# Patient Record
Sex: Male | Born: 1935 | Race: White | Hispanic: No | Smoking: Former smoker
Health system: Southern US, Community
[De-identification: ages and names within clinical notes are randomized; demographics above are authoritative.]

## PROBLEM LIST (undated history)

## (undated) DIAGNOSIS — I509 Heart failure, unspecified: Secondary | ICD-10-CM

## (undated) DIAGNOSIS — E785 Hyperlipidemia, unspecified: Secondary | ICD-10-CM

## (undated) DIAGNOSIS — I82409 Acute embolism and thrombosis of unspecified deep veins of unspecified lower extremity: Secondary | ICD-10-CM

## (undated) DIAGNOSIS — Z8739 Personal history of other diseases of the musculoskeletal system and connective tissue: Secondary | ICD-10-CM

## (undated) DIAGNOSIS — N289 Disorder of kidney and ureter, unspecified: Secondary | ICD-10-CM

## (undated) DIAGNOSIS — J449 Chronic obstructive pulmonary disease, unspecified: Secondary | ICD-10-CM

## (undated) DIAGNOSIS — C801 Malignant (primary) neoplasm, unspecified: Secondary | ICD-10-CM

## (undated) DIAGNOSIS — Z87442 Personal history of urinary calculi: Secondary | ICD-10-CM

## (undated) DIAGNOSIS — Z8546 Personal history of malignant neoplasm of prostate: Secondary | ICD-10-CM

## (undated) DIAGNOSIS — I10 Essential (primary) hypertension: Secondary | ICD-10-CM

## (undated) DIAGNOSIS — M1990 Unspecified osteoarthritis, unspecified site: Secondary | ICD-10-CM

## (undated) HISTORY — DX: Personal history of urinary calculi: Z87.442

## (undated) HISTORY — DX: Unspecified osteoarthritis, unspecified site: M19.90

## (undated) HISTORY — DX: Personal history of malignant neoplasm of prostate: Z85.46

## (undated) HISTORY — DX: Essential (primary) hypertension: I10

## (undated) HISTORY — PX: KIDNEY STONE SURGERY: SHX686

## (undated) HISTORY — DX: Personal history of other diseases of the musculoskeletal system and connective tissue: Z87.39

## (undated) HISTORY — DX: Hyperlipidemia, unspecified: E78.5

---

## 2004-10-13 ENCOUNTER — Inpatient Hospital Stay: Payer: Self-pay | Admitting: Internal Medicine

## 2004-10-13 ENCOUNTER — Ambulatory Visit: Payer: Self-pay | Admitting: Internal Medicine

## 2005-04-13 ENCOUNTER — Ambulatory Visit: Payer: Self-pay | Admitting: Radiation Oncology

## 2005-05-13 ENCOUNTER — Ambulatory Visit: Payer: Self-pay | Admitting: Radiation Oncology

## 2005-07-19 ENCOUNTER — Ambulatory Visit: Payer: Self-pay | Admitting: Unknown Physician Specialty

## 2005-12-08 ENCOUNTER — Ambulatory Visit: Payer: Self-pay | Admitting: Unknown Physician Specialty

## 2006-04-18 ENCOUNTER — Ambulatory Visit: Payer: Self-pay | Admitting: Radiation Oncology

## 2006-04-30 ENCOUNTER — Ambulatory Visit: Payer: Self-pay | Admitting: Unknown Physician Specialty

## 2006-05-13 ENCOUNTER — Ambulatory Visit: Payer: Self-pay | Admitting: Radiation Oncology

## 2007-04-18 ENCOUNTER — Ambulatory Visit: Payer: Self-pay | Admitting: Radiation Oncology

## 2007-05-14 ENCOUNTER — Ambulatory Visit: Payer: Self-pay | Admitting: Radiation Oncology

## 2007-10-02 ENCOUNTER — Ambulatory Visit: Payer: Self-pay | Admitting: Unknown Physician Specialty

## 2008-04-13 ENCOUNTER — Ambulatory Visit: Payer: Self-pay | Admitting: Radiation Oncology

## 2008-04-16 ENCOUNTER — Ambulatory Visit: Payer: Self-pay | Admitting: Radiation Oncology

## 2008-05-13 ENCOUNTER — Ambulatory Visit: Payer: Self-pay | Admitting: Radiation Oncology

## 2009-10-16 ENCOUNTER — Inpatient Hospital Stay: Payer: Self-pay | Admitting: Internal Medicine

## 2010-03-26 ENCOUNTER — Inpatient Hospital Stay: Payer: Self-pay | Admitting: Internal Medicine

## 2015-07-07 ENCOUNTER — Other Ambulatory Visit: Payer: Self-pay | Admitting: Internal Medicine

## 2015-07-07 DIAGNOSIS — R9389 Abnormal findings on diagnostic imaging of other specified body structures: Secondary | ICD-10-CM

## 2015-07-13 ENCOUNTER — Ambulatory Visit
Admission: RE | Admit: 2015-07-13 | Discharge: 2015-07-13 | Disposition: A | Discharge status: Home / Self Care | Payer: Medicare Other | Source: Ambulatory Visit | Attending: Internal Medicine | Admitting: Internal Medicine

## 2015-07-13 DIAGNOSIS — J439 Emphysema, unspecified: Secondary | ICD-10-CM | POA: Diagnosis not present

## 2015-07-13 DIAGNOSIS — K8020 Calculus of gallbladder without cholecystitis without obstruction: Secondary | ICD-10-CM | POA: Insufficient documentation

## 2015-07-13 DIAGNOSIS — R918 Other nonspecific abnormal finding of lung field: Secondary | ICD-10-CM | POA: Diagnosis present

## 2015-07-13 DIAGNOSIS — R911 Solitary pulmonary nodule: Secondary | ICD-10-CM | POA: Insufficient documentation

## 2015-07-13 DIAGNOSIS — R9389 Abnormal findings on diagnostic imaging of other specified body structures: Secondary | ICD-10-CM

## 2015-07-13 DIAGNOSIS — R938 Abnormal findings on diagnostic imaging of other specified body structures: Secondary | ICD-10-CM | POA: Diagnosis present

## 2015-07-13 MED ORDER — IOHEXOL 300 MG/ML  SOLN
75.0000 mL | Freq: Once | INTRAMUSCULAR | Status: AC | PRN
  Administered 2015-07-13: 75 mL via INTRAVENOUS

## 2015-07-15 DIAGNOSIS — I82409 Acute embolism and thrombosis of unspecified deep veins of unspecified lower extremity: Secondary | ICD-10-CM

## 2015-07-15 HISTORY — DX: Acute embolism and thrombosis of unspecified deep veins of unspecified lower extremity: I82.409

## 2015-07-26 ENCOUNTER — Ambulatory Visit: Payer: Medicare Other | Admitting: Internal Medicine

## 2015-07-26 ENCOUNTER — Other Ambulatory Visit: Payer: Medicare Other

## 2015-07-27 ENCOUNTER — Encounter: Payer: Self-pay | Admitting: *Deleted

## 2015-07-29 ENCOUNTER — Inpatient Hospital Stay: Payer: Medicare Other | Admitting: Cardiothoracic Surgery

## 2015-07-29 ENCOUNTER — Telehealth: Payer: Self-pay | Admitting: *Deleted

## 2015-07-29 NOTE — Telephone Encounter (Signed)
States he only got notice of todays appt in mail today and that is not enough time to make arrangements to get his father here. Will not be here for appt today, please call him to reschedule appt

## 2015-08-19 ENCOUNTER — Encounter: Payer: Self-pay | Admitting: Cardiothoracic Surgery

## 2015-08-19 ENCOUNTER — Inpatient Hospital Stay: Payer: Medicare Other | Attending: Cardiothoracic Surgery | Admitting: Cardiothoracic Surgery

## 2015-08-19 VITALS — BP 132/81 | HR 92 | Temp 98.5°F | Resp 92 | Ht 71.0 in | Wt 257.9 lb

## 2015-08-19 DIAGNOSIS — R918 Other nonspecific abnormal finding of lung field: Secondary | ICD-10-CM | POA: Insufficient documentation

## 2015-08-19 NOTE — Progress Notes (Signed)
Patient ID: Kirk Hubbard, male   DOB: 06/21/1923, 81 y.o.   MRN: [id number]  Chief Complaint  Patient presents with  . Right upper lobe lesion    Referral- Dr. Ginette Pitman    Referred By Dr. Matthew Saras the Reason for Referral left upper lobe mass  HPI Location, Quality, Duration, Severity, Timing, Context, Modifying Factors, Associated Signs and Symptoms.  Kirk Hubbard is a 81 y.o. male.  This very pleasant 81 year old gentleman presents today with a left upper lobe mass. He states that he went to see Dr. Braulio Conte D regarding some congestion over the summer months. He felt that he was also coughing a little bit more and was perhaps slightly more short of breath. A chest x-ray was made which was abnormal and a subsequent CT scan confirmed the presence of a 2 cm left upper lobe spiculated mass most consistent with a malignancy. Patient presents here for further evaluation. He does have a history of prostate cancer which was treated with radiotherapy in the past. 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 In addition he was a prior smoker but quit over 30 years ago. He has not had any hemoptysis. He has had no fever or chills. He does get short of breath with minimal activity.   Past Medical History  Diagnosis Date  . Hypertension   . Hyperlipidemia   . DJD (degenerative joint disease)   . History of gout   . History of kidney stones   . History of prostate cancer     History reviewed. No pertinent past surgical history.  History reviewed. No pertinent family history.  Social History Social History  Substance Use Topics  . Smoking status: Former Smoker    Types: Cigarettes    Quit date: 11/13/1988  . Smokeless tobacco: None  . Alcohol Use: No    No Known Allergies  Current Outpatient Prescriptions  Medication Sig Dispense Refill  . albuterol (PROAIR HFA) 108 (90 BASE) MCG/ACT inhaler Inhale into the lungs.    Marland Kitchen allopurinol  (ZYLOPRIM) 300 MG tablet TAKE ONE TABLET BY MOUTH ONCE DAILY    . aspirin EC 81 MG tablet Take by mouth.    . furosemide (LASIX) 20 MG tablet Take by mouth.    Marland Kitchen lisinopril (PRINIVIL,ZESTRIL) 20 MG tablet TAKE ONE TABLET BY MOUTH ONCE DAILY    . lovastatin (MEVACOR) 20 MG tablet TAKE ONE TABLET BY MOUTH EVERY NIGHT    . metoprolol (LOPRESSOR) 50 MG tablet Take by mouth.     No current facility-administered medications for this visit.      Review of Systems A complete review of systems was asked and was negative except for the following positive findings shortness of breath, swelling of his lower extremities. Coughing, hearing difficulty, frequent urination, joint pain, some cough.  Blood pressure 132/81, pulse 92, temperature 98.5 F (36.9 C), temperature source Tympanic, resp. rate 92, height 5\' 11"  (1.803 m), weight 257 lb 15 oz (117 kg), SpO2 94 %.  Physical Exam CONSTITUTIONAL:  Pleasant, well-developed, well-nourished, and in no acute distress. EYES: Pupils equal and reactive to light, Sclera non-icteric EARS, NOSE, MOUTH AND THROAT:  The oropharynx was clear.  Dentition is good repair.  Oral mucosa pink and moist. LYMPH NODES:  Lymph nodes in the neck and axillae were normal RESPIRATORY:  Lungs rales at bases.  Normal respiratory effort without pathologic use of accessory muscles of respiration CARDIOVASCULAR: Heart was regular with  2/6  murmurs.  There were no carotid bruits but right sided transmitted bruit. GI: The abdomen was soft, nontender, and nondistended. There were no palpable masses. There was no hepatosplenomegaly. There were normal bowel sounds in all quadrants. GU:  Rectal deferred.   MUSCULOSKELETAL:  Normal muscle strength and tone.  No clubbing or cyanosis.   SKIN:  There were no pathologic skin lesions.  There were no nodules on palpation. NEUROLOGIC:  Sensation is normal.  Cranial nerves are grossly intact. PSYCH:  Oriented to person, place and time.  Mood and  affect are normal.  Data Reviewed I have independently reviewed the patient's CT scan.  I have personally reviewed the patient's imaging, laboratory findings and medical records.    Assessment    There is a spiculated left upper lobe mass most consistent with a malignancy. I reviewed with the patient and his 2 sons today the options. I do not believe he is a surgical candidate based upon his age. I did discuss with him the possibility of performing a CT-guided needle biopsy with subsequent SB RT. We also discussed obtaining another CT scan in several weeks or doing nothing. They would like to think about their options and will be back in touch.    Plan    At the present time they would like to think about their options. I did recommend to him that radiation therapy has minimal side effects and certainly has benefit for early stages a lung cancer. However given his advanced age he may choose to do nothing or watch. I encouraged him to contact me as soon as possible. Of also asked him to stop their aspirin and fish oil in preparation for a CT-guided needle biopsy.       Nestor Lewandowsky, MD 08/19/2015, 11:07 AM

## 2015-09-02 ENCOUNTER — Other Ambulatory Visit: Payer: Self-pay | Admitting: *Deleted

## 2015-09-02 DIAGNOSIS — R911 Solitary pulmonary nodule: Secondary | ICD-10-CM | POA: Insufficient documentation

## 2015-09-06 ENCOUNTER — Other Ambulatory Visit: Payer: Self-pay | Admitting: Cardiothoracic Surgery

## 2015-09-06 DIAGNOSIS — R918 Other nonspecific abnormal finding of lung field: Secondary | ICD-10-CM

## 2015-09-10 ENCOUNTER — Ambulatory Visit: Payer: Medicare Other

## 2015-09-14 ENCOUNTER — Telehealth: Payer: Self-pay | Admitting: *Deleted

## 2015-09-14 NOTE — Telephone Encounter (Signed)
  Oncology Nurse Navigator Documentation    Navigator Encounter Type: Telephone (09/14/15 0900)    Notified, son, poa, that due to inability to obtain CT or PET scan at this time, after discussion with Dr. Kathlene Cote, interventional radiology, a three month follow up CT scan will be appropriate. Will follow.

## 2015-09-17 ENCOUNTER — Other Ambulatory Visit: Payer: Self-pay | Admitting: *Deleted

## 2015-09-17 DIAGNOSIS — R911 Solitary pulmonary nodule: Secondary | ICD-10-CM

## 2015-10-08 ENCOUNTER — Emergency Department: Payer: Medicare Other

## 2015-10-08 ENCOUNTER — Encounter: Payer: Self-pay | Admitting: Emergency Medicine

## 2015-10-08 ENCOUNTER — Inpatient Hospital Stay
Admission: EM | Admit: 2015-10-08 | Discharge: 2015-10-12 | DRG: 300 | Disposition: A | Discharge status: Home / Self Care | Payer: Medicare Other | Attending: Internal Medicine | Admitting: Internal Medicine

## 2015-10-08 DIAGNOSIS — R0902 Hypoxemia: Secondary | ICD-10-CM | POA: Diagnosis present

## 2015-10-08 DIAGNOSIS — Z79899 Other long term (current) drug therapy: Secondary | ICD-10-CM

## 2015-10-08 DIAGNOSIS — I82409 Acute embolism and thrombosis of unspecified deep veins of unspecified lower extremity: Secondary | ICD-10-CM

## 2015-10-08 DIAGNOSIS — Z66 Do not resuscitate: Secondary | ICD-10-CM | POA: Diagnosis present

## 2015-10-08 DIAGNOSIS — L0390 Cellulitis, unspecified: Secondary | ICD-10-CM

## 2015-10-08 DIAGNOSIS — R0602 Shortness of breath: Secondary | ICD-10-CM

## 2015-10-08 DIAGNOSIS — M1990 Unspecified osteoarthritis, unspecified site: Secondary | ICD-10-CM | POA: Diagnosis present

## 2015-10-08 DIAGNOSIS — Z87442 Personal history of urinary calculi: Secondary | ICD-10-CM

## 2015-10-08 DIAGNOSIS — Z87891 Personal history of nicotine dependence: Secondary | ICD-10-CM

## 2015-10-08 DIAGNOSIS — Z8546 Personal history of malignant neoplasm of prostate: Secondary | ICD-10-CM | POA: Diagnosis not present

## 2015-10-08 DIAGNOSIS — E785 Hyperlipidemia, unspecified: Secondary | ICD-10-CM | POA: Diagnosis present

## 2015-10-08 DIAGNOSIS — J441 Chronic obstructive pulmonary disease with (acute) exacerbation: Secondary | ICD-10-CM | POA: Diagnosis present

## 2015-10-08 DIAGNOSIS — Z7982 Long term (current) use of aspirin: Secondary | ICD-10-CM

## 2015-10-08 DIAGNOSIS — I10 Essential (primary) hypertension: Secondary | ICD-10-CM | POA: Diagnosis present

## 2015-10-08 DIAGNOSIS — Z8249 Family history of ischemic heart disease and other diseases of the circulatory system: Secondary | ICD-10-CM

## 2015-10-08 DIAGNOSIS — L03116 Cellulitis of left lower limb: Secondary | ICD-10-CM | POA: Diagnosis present

## 2015-10-08 DIAGNOSIS — R609 Edema, unspecified: Secondary | ICD-10-CM

## 2015-10-08 DIAGNOSIS — I82402 Acute embolism and thrombosis of unspecified deep veins of left lower extremity: Secondary | ICD-10-CM | POA: Diagnosis present

## 2015-10-08 DIAGNOSIS — T17990A Other foreign object in respiratory tract, part unspecified in causing asphyxiation, initial encounter: Secondary | ICD-10-CM | POA: Diagnosis present

## 2015-10-08 DIAGNOSIS — L539 Erythematous condition, unspecified: Secondary | ICD-10-CM

## 2015-10-08 DIAGNOSIS — I509 Heart failure, unspecified: Secondary | ICD-10-CM | POA: Diagnosis present

## 2015-10-08 DIAGNOSIS — R911 Solitary pulmonary nodule: Secondary | ICD-10-CM | POA: Diagnosis present

## 2015-10-08 HISTORY — DX: Disorder of kidney and ureter, unspecified: N28.9

## 2015-10-08 HISTORY — DX: Malignant (primary) neoplasm, unspecified: C80.1

## 2015-10-08 HISTORY — DX: Heart failure, unspecified: I50.9

## 2015-10-08 LAB — COMPREHENSIVE METABOLIC PANEL
ALT: 14 U/L — ABNORMAL LOW (ref 17–63)
ANION GAP: 8 (ref 5–15)
AST: 18 U/L (ref 15–41)
Albumin: 4.1 g/dL (ref 3.5–5.0)
Alkaline Phosphatase: 92 U/L (ref 38–126)
BILIRUBIN TOTAL: 0.7 mg/dL (ref 0.3–1.2)
BUN: 17 mg/dL (ref 6–20)
CO2: 33 mmol/L — ABNORMAL HIGH (ref 22–32)
Calcium: 9.7 mg/dL (ref 8.9–10.3)
Chloride: 103 mmol/L (ref 101–111)
Creatinine, Ser: 0.97 mg/dL (ref 0.61–1.24)
Glucose, Bld: 130 mg/dL — ABNORMAL HIGH (ref 65–99)
POTASSIUM: 4.5 mmol/L (ref 3.5–5.1)
Sodium: 144 mmol/L (ref 135–145)
TOTAL PROTEIN: 7.6 g/dL (ref 6.5–8.1)

## 2015-10-08 LAB — CBC
HEMATOCRIT: 45.3 % (ref 40.0–52.0)
Hemoglobin: 14.5 g/dL (ref 13.0–18.0)
MCH: 30.2 pg (ref 26.0–34.0)
MCHC: 32.1 g/dL (ref 32.0–36.0)
MCV: 94.1 fL (ref 80.0–100.0)
Platelets: 221 10*3/uL (ref 150–440)
RBC: 4.81 MIL/uL (ref 4.40–5.90)
RDW: 14.3 % (ref 11.5–14.5)
WBC: 9.3 10*3/uL (ref 3.8–10.6)

## 2015-10-08 LAB — TROPONIN I: Troponin I: <0.03 ng/mL (ref <0.031)

## 2015-10-08 LAB — BRAIN NATRIURETIC PEPTIDE: B NATRIURETIC PEPTIDE 5: 19 pg/mL (ref 0.0–100.0)

## 2015-10-08 MED ORDER — LISINOPRIL 20 MG PO TABS
20.0000 mg | ORAL_TABLET | Freq: Every day | ORAL | Status: DC
  Administered 2015-10-09 – 2015-10-12 (×4): 20 mg via ORAL
  Filled 2015-10-08 (×5): qty 1

## 2015-10-08 MED ORDER — APIXABAN 2.5 MG PO TABS
5.0000 mg | ORAL_TABLET | Freq: Two times a day (BID) | ORAL | Status: DC
  Administered 2015-10-09 – 2015-10-11 (×5): 5 mg via ORAL
  Filled 2015-10-08 (×5): qty 2

## 2015-10-08 MED ORDER — SODIUM CHLORIDE 0.9 % IV SOLN
1.5000 g | Freq: Three times a day (TID) | INTRAVENOUS | Status: DC
  Administered 2015-10-08 – 2015-10-12 (×11): 1.5 g via INTRAVENOUS
  Filled 2015-10-08 (×14): qty 1.5

## 2015-10-08 MED ORDER — ALBUTEROL SULFATE (2.5 MG/3ML) 0.083% IN NEBU: 3.0000 mL | INHALATION_SOLUTION | Freq: Four times a day (QID) | RESPIRATORY_TRACT | Status: DC | PRN

## 2015-10-08 MED ORDER — ENOXAPARIN SODIUM 120 MG/0.8ML ~~LOC~~ SOLN
1.0000 mg/kg | Freq: Once | SUBCUTANEOUS | Status: AC
  Administered 2015-10-08: 110 mg via SUBCUTANEOUS
  Filled 2015-10-08: qty 0.8

## 2015-10-08 MED ORDER — ALLOPURINOL 100 MG PO TABS
300.0000 mg | ORAL_TABLET | Freq: Every day | ORAL | Status: DC
Start: 2015-10-09 — End: 2015-10-12
  Administered 2015-10-09 – 2015-10-12 (×4): 300 mg via ORAL
  Filled 2015-10-08 (×4): qty 3

## 2015-10-08 MED ORDER — ASPIRIN EC 81 MG PO TBEC
81.0000 mg | DELAYED_RELEASE_TABLET | Freq: Every day | ORAL | Status: DC
  Administered 2015-10-09 – 2015-10-12 (×4): 81 mg via ORAL
  Filled 2015-10-08 (×4): qty 1

## 2015-10-08 MED ORDER — VANCOMYCIN HCL IN DEXTROSE 1-5 GM/200ML-% IV SOLN
1000.0000 mg | INTRAVENOUS | Status: DC | PRN
  Administered 2015-10-08: 1000 mg via INTRAVENOUS
  Filled 2015-10-08 (×2): qty 200

## 2015-10-08 MED ORDER — PRAVASTATIN SODIUM 20 MG PO TABS
20.0000 mg | ORAL_TABLET | Freq: Every day | ORAL | Status: DC
  Administered 2015-10-09 – 2015-10-11 (×3): 20 mg via ORAL
  Filled 2015-10-08 (×3): qty 1

## 2015-10-08 MED ORDER — FUROSEMIDE 20 MG PO TABS
20.0000 mg | ORAL_TABLET | Freq: Every day | ORAL | Status: DC
  Administered 2015-10-09 – 2015-10-12 (×4): 20 mg via ORAL
  Filled 2015-10-08 (×4): qty 1

## 2015-10-08 MED ORDER — IOHEXOL 350 MG/ML SOLN
75.0000 mL | Freq: Once | INTRAVENOUS | Status: AC | PRN
  Administered 2015-10-08: 75 mL via INTRAVENOUS

## 2015-10-08 MED ORDER — BUDESONIDE-FORMOTEROL FUMARATE 160-4.5 MCG/ACT IN AERO
2.0000 | INHALATION_SPRAY | Freq: Two times a day (BID) | RESPIRATORY_TRACT | Status: DC
  Administered 2015-10-09 – 2015-10-12 (×7): 2 via RESPIRATORY_TRACT
  Filled 2015-10-08: qty 6

## 2015-10-08 NOTE — H&P (Signed)
[facility] [place] NAME: Kirk Hubbard    MR#:  [id number]  DATE OF BIRTH:  06/21/1923  DATE OF ADMISSION:  10/08/2015  PRIMARY CARE PHYSICIAN: Tracie Harrier, MD   REQUESTING/REFERRING PHYSICIAN: Marcelene Butte  CHIEF COMPLAINT:   Chief Complaint  Patient presents with  . Leg Swelling    HISTORY OF PRESENT ILLNESS: Kirk Hubbard  is a 81 y.o. male with a known history of hypertension, hyperlipidemia, degenerative joint disease, history of kidney stone, CHF, history of prostate cancer, recently found lung nodules and ground workup for that. He lives alone at home and for last few days his left leg is getting swollen and which is getting worse yesterday he it started getting red and more swollen so he decided to come to emergency room for that. He denies any injury or pain, no fever. In ER DVT study found positive on the left side and he also noted to have cellulitis on his left lower extremity CT scan of the chest was done to rule out pulmonary embolism which was negative but it reported multiple nodules in his both lungs.  PAST MEDICAL HISTORY:   Past Medical History  Diagnosis Date  . Hypertension   . Hyperlipidemia   . DJD (degenerative joint disease)   . History of gout   . History of kidney stones   . History of prostate cancer   . Renal disorder     kidney stones  . Cancer ([place])   . CHF (congestive heart failure) ([place])     PAST SURGICAL HISTORY:  Past Surgical History  Procedure Laterality Date  . Kidney stone surgery      SOCIAL HISTORY:  Social History  Substance Use Topics  . Smoking status: Former Smoker    Types: Cigarettes    Quit date: 11/13/1988  . Smokeless tobacco: Not on file  . Alcohol Use: No    FAMILY HISTORY:  Family History  Problem Relation Age of Onset  . Congestive Heart Failure Brother     DRUG ALLERGIES: No Known Allergies  REVIEW OF SYSTEMS:   CONSTITUTIONAL: No fever, positive for  fatigue or weakness.  EYES: No blurred or double vision.  EARS, NOSE, AND THROAT: No tinnitus or ear pain.  RESPIRATORY: No cough, shortness of breath, wheezing or hemoptysis.  CARDIOVASCULAR: No chest pain, orthopnea, edema.  GASTROINTESTINAL: No nausea, vomiting, diarrhea or abdominal pain.  GENITOURINARY: No dysuria, hematuria.  ENDOCRINE: No polyuria, nocturia,  HEMATOLOGY: No anemia, easy bruising or bleeding SKIN: No rash or lesion. Redness on left leg MUSCULOSKELETAL: No joint pain or arthritis.  Left leg swelling. NEUROLOGIC: No tingling, numbness, weakness.  PSYCHIATRY: No anxiety or depression.   MEDICATIONS AT HOME:  Prior to Admission medications   Medication Sig Start Date End Date Taking? Authorizing Provider  albuterol (PROVENTIL HFA;VENTOLIN HFA) 108 (90 BASE) MCG/ACT inhaler Inhale 2 puffs into the lungs every 6 (six) hours as needed for wheezing or shortness of breath.   Yes Historical Provider, MD  allopurinol (ZYLOPRIM) 300 MG tablet Take 300 mg by mouth daily.   Yes Historical Provider, MD  aspirin EC 81 MG tablet Take 81 mg by mouth 2 (two) times daily.    Yes Historical Provider, MD  budesonide-formoterol (SYMBICORT) 160-4.5 MCG/ACT inhaler Inhale 2 puffs into the lungs 2 (two) times daily.   Yes Historical Provider, MD  furosemide (LASIX) 20 MG tablet Take 20 mg by mouth daily. Pt is able to take an additional tablet  if needed for edema.   Yes Historical Provider, MD  lisinopril (PRINIVIL,ZESTRIL) 20 MG tablet Take 20 mg by mouth daily.   Yes Historical Provider, MD  lovastatin (MEVACOR) 20 MG tablet Take 20 mg by mouth at bedtime.   Yes Historical Provider, MD      PHYSICAL EXAMINATION:   VITAL SIGNS: Blood pressure 132/77, pulse 92, temperature 97.6 F (36.4 C), temperature source Oral, resp. rate 25, height 5\' 10"  (1.778 m), weight 111.131 kg (245 lb), SpO2 96 %.  GENERAL:  81 y.o.-year-old patient lying in the bed with no acute distress.  EYES: Pupils  equal, round, reactive to light and accommodation. No scleral icterus. Extraocular muscles intact.  HEENT: Head atraumatic, normocephalic. Oropharynx and nasopharynx clear.  NECK:  Supple, no jugular venous distention. No thyroid enlargement, no tenderness.  LUNGS: Normal breath sounds bilaterally, no wheezing, rales,rhonchi or crepitation. No use of accessory muscles of respiration.  CARDIOVASCULAR: S1, S2 normal. Positive murmurs.  ABDOMEN: Soft, nontender, nondistended. Bowel sounds present. No organomegaly or mass.  EXTREMITIES: Positive bilateral pedal edema, no cyanosis, or clubbing. Left lower extremity is red and warm to touch NEUROLOGIC: Cranial nerves II through XII are intact. Muscle strength 5/5 in all extremities. Sensation intact. Gait not checked.  PSYCHIATRIC: The patient is alert and oriented x 3.  SKIN: No obvious rash, lesion, or ulcer.   LABORATORY PANEL:   CBC  Recent Labs Lab 10/08/15 1727  WBC 9.3  HGB 14.5  HCT 45.3  PLT 221  MCV 94.1  MCH 30.2  MCHC 32.1  RDW 14.3   ------------------------------------------------------------------------------------------------------------------  Chemistries   Recent Labs Lab 10/08/15 1727  NA 144  K 4.5  CL 103  CO2 33*  GLUCOSE 130*  BUN 17  CREATININE 0.97  CALCIUM 9.7  AST 18  ALT 14*  ALKPHOS 92  BILITOT 0.7   ------------------------------------------------------------------------------------------------------------------ estimated creatinine clearance is 63.1 mL/min (by C-G formula based on Cr of 0.97). ------------------------------------------------------------------------------------------------------------------ No results for input(s): TSH, T4TOTAL, T3FREE, THYROIDAB in the last 72 hours.  Invalid input(s): FREET3   Coagulation profile No results for input(s): INR, PROTIME in the last 168  hours. ------------------------------------------------------------------------------------------------------------------- No results for input(s): DDIMER in the last 72 hours. -------------------------------------------------------------------------------------------------------------------  Cardiac Enzymes  Recent Labs Lab 10/07/15 1930  TROPONINI <0.03   ------------------------------------------------------------------------------------------------------------------ Invalid input(s): POCBNP  ---------------------------------------------------------------------------------------------------------------  Urinalysis No results found for: COLORURINE, APPEARANCEUR, LABSPEC, PHURINE, GLUCOSEU, HGBUR, BILIRUBINUR, KETONESUR, PROTEINUR, UROBILINOGEN, NITRITE, LEUKOCYTESUR   RADIOLOGY: Dg Chest 2 View  10/08/2015  CLINICAL DATA:  Shortness of Breath EXAM: CHEST  2 VIEW COMPARISON:  07/13/2015 and report 09/21/2015 FINDINGS: Cardiomediastinal silhouette is stable. Persistent nodular density in left upper lobe measures 1.7 cm. Further correlation with PET scan is recommended to exclude malignancy. No acute infiltrate or pleural effusion. No pulmonary edema. Degenerative changes thoracic spine again noted. IMPRESSION: Persistent nodular density in left upper lobe measures 1.7 cm. Further correlation with PET scan is recommended to exclude malignancy. No acute infiltrate or pleural effusion. No pulmonary edema. Electronically Signed   By: Lahoma Crocker M.D.   On: 10/08/2015 17:59   Ct Angio Chest Pe W/cm &/or Wo Cm  10/08/2015  CLINICAL DATA:  Shortness of breath. Left lower leg pain and swelling. EXAM: CT ANGIOGRAPHY CHEST WITH CONTRAST TECHNIQUE: Multidetector CT imaging of the chest was performed using the standard protocol during bolus administration of intravenous contrast. Multiplanar CT image reconstructions and MIPs were obtained to evaluate the vascular anatomy. CONTRAST:  34mL OMNIPAQUE  IOHEXOL 350  MG/ML SOLN COMPARISON:  10/08/2015; 07/13/2015 FINDINGS: Mediastinum/Nodes: Extensive motion artifact during scanning of lung bases obscures a portion of the pulmonary arterial tree. With this limitation in mind, No filling defect is identified in the pulmonary arterial tree to suggest pulmonary embolus. Coronary, aortic arch, and branch vessel atherosclerotic vascular disease. No aortic dissection or acute aortic findings. There is some calcification of the aortic valve leaflets. Mild cardiomegaly is present. A somewhat heterogeneous nodular right paratracheal density measures 1.9 by 2.6 cm on image 21 of series 4 and previously measured the same on 07/13/2015. This has a small stalk like connection to the thyroid gland and could well represent a thyroid nodule, with pathologic adenopathy as a less likely differential diagnostic consideration given the lack of any adenopathy elsewhere in the chest. Lungs/Pleura: Airway thickening is present, suggesting bronchitis or reactive airways disease. 3.2 by 2.1 cm sub solid lesion along the right hilum, stable. Stable scarring in the right middle lobe. Considerable airway plugging and airway thickening causing luminal occlusion in the lower lobe tracheobronchial tree. 3 mm apical posterior segment left upper lobe nodule, image 22 series 6. Faint 1.3 by 0.8 cm sub solid nodule anteriorly in the left upper lobe on image 34 series 6, not well seen on the prior exam. Laterally in the left upper lobe there is a partially sub solid 2.9 by 2.0 cm nodule, previously 2.6 by 1.8 cm, currently on image 55 series 8. Upper abdomen: Partial visualization of a cystic lesion of the right kidney upper pole. Musculoskeletal: Thoracic spondylosis. Slight irregularity posteriorly in the T1 vertebral body with some underlying sclerosis and lucency, involved region approximately 1.6 cm, nonspecific. Review of the MIP images confirms the above findings. IMPRESSION: 1. The left upper  lobe nodule persists and is slightly larger than on the August exam. Although partially sub solid, this lesion has a solid portion that is greater than 5 mm in size. Based on current guidelines, biopsy or surgical resection is recommended due to significant risk of adenocarcinoma. This recommendation follows the consensus statement: Recommendations for the Management of Subsolid Pulmonary Nodules Detected at CT: A Statement from the [place] as published in Radiology 2013; 266:304-317. 2. In addition, there is a 3.2 by 2.1 cm sub solid mass in the right perihilar region which is persistent and concerning for malignancy, although more purely sub solid without a significant solid component. 3. 1.3 by 0.8 cm faintly sub solid nodule anteriorly in the left upper lobe also merits observation, but was not well seen on the prior exam. 4. No embolus is identified. Please note that portions of the lung bases are completely obscured by motion artifact, Cecille Rubin negative predictive value of today' s exam. 5. Coronary, aortic arch, and branch vessel atherosclerotic vascular disease. Mild cardiomegaly. 6. Airway thickening, especially severe in the lower lobes, with some airway plugging in the lower lobes. 7. Stable 1.9 by 2.6 cm nodular right paratracheal density in the upper paratracheal range, somewhat heterogeneous. This seems to have a small stalk like connection to the thyroid gland and is probably a thyroid nodule, less likely to be pathologic paratracheal adenopathy. 8. Nonspecific mixed sclerosis and lucency posteriorly in the T1 vertebral body, could be a hemangioma but I cannot exclude malignancy. Electronically Signed   By: Van Clines M.D.   On: 10/08/2015 20:27   US Venous Img Lower Unilateral Left  10/08/2015  CLINICAL DATA:  Swelling and redness in the calf.  Previous DVTs. EXAM: Left LOWER EXTREMITY VENOUS DOPPLER ULTRASOUND  TECHNIQUE: Gray-scale sonography with graded compression, as well as  color Doppler and duplex ultrasound were performed to evaluate the lower extremity deep venous systems from the level of the common femoral vein and including the common femoral, femoral, profunda femoral, popliteal and calf veins including the posterior tibial, peroneal and gastrocnemius veins when visible. The superficial great saphenous vein was also interrogated. Spectral Doppler was utilized to evaluate flow at rest and with distal augmentation maneuvers in the common femoral, femoral and popliteal veins. COMPARISON:  None. FINDINGS: Contralateral Common Femoral Vein: Respiratory phasicity is normal and symmetric with the symptomatic side. No evidence of thrombus. Normal compressibility. Common Femoral Vein: No evidence of thrombus. Normal compressibility, respiratory phasicity and response to augmentation. Saphenofemoral Junction: No evidence of thrombus. Normal compressibility and flow on color Doppler imaging. Profunda Femoral Vein: No evidence of thrombus. Normal compressibility and flow on color Doppler imaging. Femoral Vein: Partial filling defect demonstrated in the distal superficial femoral vein consistent with nonocclusive thrombus. Popliteal Vein: Filling defect demonstrated in the popliteal vein consistent with nonocclusive thrombus. Calf Veins: No evidence of thrombus. Normal compressibility and flow on color Doppler imaging. Superficial Great Saphenous Vein: No evidence of thrombus. Normal compressibility and flow on color Doppler imaging. Venous Reflux:  None. Other Findings:  None. IMPRESSION: Positive for deep venous thrombosis. Nonocclusive thrombus demonstrated in the distal left superficial femoral vein and in the popliteal vein. These results were called by telephone at the time of interpretation on 10/08/2015 at 6:33 pm to Dr. Marcelene Butte, who verbally acknowledged these results. Electronically Signed   By: Lucienne Capers M.D.   On: 10/08/2015 18:35    IMPRESSION AND PLAN:  * Cellulitis  on left lower extremity  IV Unasyn  Monitor.  * Left lower extremity DVT   Given 1 injection Lovenox by ER  Start on Eliquis tomorrow.  * Multiple lung nodules  As per the primary care physician's note- they are working on getting insurance authorizing physician to get lung biopsy and further workup.  * COPD  No active wheezing, continue home inhalers.  * Hypertension  Continue lisinopril.  * Generalized weakness  Get physical therapy evaluation.  All the records are reviewed and case discussed with ED provider. Management plans discussed with the patient, family and they are in agreement.  CODE STATUS: DO NOT RESUSCITATE Advance Directive Documentation        Most Recent Value   Type of Advance Directive  Healthcare Power of Attorney, Living will- his elder son    Pre-existing out of facility DNR order (yellow form or pink MOST form)     "MOST" Form in Place?         TOTAL TIME TAKING CARE OF THIS PATIENT: 50 minutes.    Vaughan Basta M.D on 10/08/2015   Between 7am to 6pm - Pager - [phone]  After 6pm go to [email or web address] - password [email or web address] [facility]  Office  [phone]  CC: Primary care physician; Tracie Harrier, MD   Note: This dictation was prepared with Dragon dictation along with smaller phrase technology. Any transcriptional errors that result from this process are unintentional.
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
## 2015-10-08 NOTE — ED Provider Notes (Signed)
Time Seen: Approximately 1900 I have reviewed the triage notes  Chief Complaint: Leg Swelling   History of Present Illness: Kirk Hubbard is a 81 y.o. male who presents with leg pain and swelling. Patient has a history of what sounds like peripheral edema and is noticed some increased redness and swelling in his left lower extremity. Patient describes shortness of breath though is not unusual for him. He states he has a history congestive heart failure and has recently been diagnosed with cancer. Patient's not aware of any fever at home. He is also noticed some mild redness in the right lower extremity. Patient is a very poor historian and family member who is present does not have much to add to the current history and review of systems. Past Medical History  Diagnosis Date  . Hypertension   . Hyperlipidemia   . DJD (degenerative joint disease)   . History of gout   . History of kidney stones   . History of prostate cancer   . Renal disorder     kidney stones  . Cancer ([place])   . CHF (congestive heart failure) [facility])     Patient Active Problem List   Diagnosis Date Noted  . Cellulitis 10/08/2015  . DVT (deep venous thrombosis) ([place]) 10/08/2015  . Lung nodule seen on imaging study 09/02/2015    Past Surgical History  Procedure Laterality Date  . Kidney stone surgery      Past Surgical History  Procedure Laterality Date  . Kidney stone surgery      No current outpatient prescriptions on file.  Allergies:  Review of patient's allergies indicates no known allergies.  Family History: Family History  Problem Relation Age of Onset  . Congestive Heart Failure Brother     Social History: Social History  Substance Use Topics  . Smoking status: Former Smoker    Types: Cigarettes    Quit date: 11/13/1988  . Smokeless tobacco: None  . Alcohol Use: No     Review of Systems:   10 point review of systems was performed and was otherwise negative:  Constitutional: No  fever Eyes: No visual disturbances ENT: No sore throat, ear pain Cardiac: No current chest pain Respiratory: Chronic shortness of breath Abdomen: No abdominal pain, no vomiting, No diarrhea Endocrine: No weight loss, No night sweats Extremities: Describes peripheral edema in both lower extremities left side worse than the right Skin: No rashes, easy bruising Neurologic: No focal weakness, trouble with speech or swollowing Urologic: No dysuria, Hematuria, or urinary frequency   Physical Exam:  ED Triage Vitals  Enc Vitals Group     BP 10/08/15 1701 138/56 mmHg     Pulse Rate 10/08/15 1701 99     Resp 10/08/15 1701 20     Temp 10/08/15 1701 97.6 F (36.4 C)     Temp Source 10/08/15 1701 Oral     SpO2 10/08/15 1701 90 %     Weight 10/08/15 1701 245 lb (111.131 kg)     Height 10/08/15 1701 5\' 10"  (1.778 m)     Head Cir --      Peak Flow --      Pain Score 10/08/15 1704 5     Pain Loc --      Pain Edu? --      Excl. in [place]? --     General: Awake , Alert , and Oriented times 3; GCS 15 Head: Normal cephalic , atraumatic Eyes: Pupils equal , round, reactive to  light Nose/Throat: No nasal drainage, patent upper airway without erythema or exudate.  Neck: Supple, Full range of motion, No anterior adenopathy or palpable thyroid masses Lungs patient has some mild rales auscultated at the bases with no wheezes or rhonchi noted Heart: Regular rate, regular rhythm without murmurs , gallops , or rubs Abdomen: Soft, non tender without rebound, guarding , or rigidity; bowel sounds positive and symmetric in all 4 quadrants. No organomegaly .        Extremities: Examination of both lower extremity shows circumferential edema although much more in the left versus the right. There is some mild serosanguineous type drainage in the left lower extremity with dense erythema in a stocking type distribution on the left anterior surface of the leg. Mildly tender to the touch with a negative Homans  sign Neurologic: normal ambulation, Motor symmetric without deficits, sensory intact Skin: warm, dry, no rashes   Labs:   All laboratory work was reviewed including any pertinent negatives or positives listed below:  Labs Reviewed  COMPREHENSIVE METABOLIC PANEL - Abnormal; Notable for the following:    CO2 33 (*)    Glucose, Bld 130 (*)    ALT 14 (*)    All other components within normal limits  CULTURE, BLOOD (ROUTINE X 2)  CULTURE, BLOOD (ROUTINE X 2)  CBC  BRAIN NATRIURETIC PEPTIDE  TROPONIN I  BASIC METABOLIC PANEL  CBC   Laboratory work was reviewed and showed no significant abnormalities EKG:  ED ECG REPORT I, Daymon Larsen, the attending physician, personally viewed and interpreted this ECG.  Date: 10/08/2015 EKG Time: 1913 Rate 91 Rhythm: normal sinus rhythm QRS Axis: normal Intervals: Short PR interval ST/T Wave abnormalities: normal Conduction Disutrbances: none Narrative Interpretation: unremarkable Nonspecific intraventricular block No acute ischemic changes are noted  Radiology:     EXAM: CT ANGIOGRAPHY CHEST WITH CONTRAST  TECHNIQUE: Multidetector CT imaging of the chest was performed using the standard protocol during bolus administration of intravenous contrast. Multiplanar CT image reconstructions and MIPs were obtained to evaluate the vascular anatomy.  CONTRAST: 78mL OMNIPAQUE IOHEXOL 350 MG/ML SOLN  COMPARISON: 10/08/2015; 07/13/2015  FINDINGS: Mediastinum/Nodes: Extensive motion artifact during scanning of lung bases obscures a portion of the pulmonary arterial tree. With this limitation in mind, No filling defect is identified in the pulmonary arterial tree to suggest pulmonary embolus.  Coronary, aortic arch, and branch vessel atherosclerotic vascular disease. No aortic dissection or acute aortic findings. There is some calcification of the aortic valve leaflets. Mild cardiomegaly is present.  A somewhat heterogeneous  nodular right paratracheal density measures 1.9 by 2.6 cm on image 21 of series 4 and previously measured the same on 07/13/2015. This has a small stalk like connection to the thyroid gland and could well represent a thyroid nodule, with pathologic adenopathy as a less likely differential diagnostic consideration given the lack of any adenopathy elsewhere in the chest.  Lungs/Pleura: Airway thickening is present, suggesting bronchitis or reactive airways disease. 3.2 by 2.1 cm sub solid lesion along the right hilum, stable. Stable scarring in the right middle lobe. Considerable airway plugging and airway thickening causing luminal occlusion in the lower lobe tracheobronchial tree.  3 mm apical posterior segment left upper lobe nodule, image 22 series 6. Faint 1.3 by 0.8 cm sub solid nodule anteriorly in the left upper lobe on image 34 series 6, not well seen on the prior exam. Laterally in the left upper lobe there is a partially sub solid 2.9 by 2.0 cm nodule,  previously 2.6 by 1.8 cm, currently on image 55 series 8.  Upper abdomen: Partial visualization of a cystic lesion of the right kidney upper pole.  Musculoskeletal: Thoracic spondylosis. Slight irregularity posteriorly in the T1 vertebral body with some underlying sclerosis and lucency, involved region approximately 1.6 cm, nonspecific.  Review of the MIP images confirms the above findings.  IMPRESSION: 1. The left upper lobe nodule persists and is slightly larger than on the August exam. Although partially sub solid, this lesion has a solid portion that is greater than 5 mm in size. Based on current guidelines, biopsy or surgical resection is recommended due to significant risk of adenocarcinoma. This recommendation follows the consensus statement: Recommendations for the Management of Subsolid Pulmonary Nodules Detected at CT: A Statement from the [employer] as published in Radiology 2013; 266:304-317. 2. In  addition, there is a 3.2 by 2.1 cm sub solid mass in the right perihilar region which is persistent and concerning for malignancy, although more purely sub solid without a significant solid component. 3. 1.3 by 0.8 cm faintly sub solid nodule anteriorly in the left upper lobe also merits observation, but was not well seen on the prior exam. 4. No embolus is identified. Please note that portions of the lung bases are completely obscured by motion artifact, Cecille Rubin negative predictive value of today' s exam. 5. Coronary, aortic arch, and branch vessel atherosclerotic vascular disease. Mild cardiomegaly. 6. Airway thickening, especially severe in the lower lobes, with some airway plugging in the lower lobes. 7. Stable 1.9 by 2.6 cm nodular right paratracheal density in the upper paratracheal range, somewhat heterogeneous. This seems to have a small stalk like connection to the thyroid gland and is probably a thyroid nodule, less likely to be pathologic paratracheal adenopathy. 8. Nonspecific mixed sclerosis and lucency posteriorly in the T1 vertebral body, could be a hemangioma but I cannot exclude malignancy.   Electronically Signed By: Van Clines M.D. On: 10/08/2015 20:27          US Venous Img Lower Unilateral Left (Final result) Result time: 10/08/15 18:35:55   Procedure changed from [place] Extrem Low Left Comp      Final result by Rad Results In Interface (10/08/15 18:35:55)   Narrative:   CLINICAL DATA: Swelling and redness in the calf. Previous DVTs.  EXAM: Left LOWER EXTREMITY VENOUS DOPPLER ULTRASOUND  TECHNIQUE: Gray-scale sonography with graded compression, as well as color Doppler and duplex ultrasound were performed to evaluate the lower extremity deep venous systems from the level of the common femoral vein and including the common femoral, femoral, profunda femoral, popliteal and calf veins including the posterior tibial, peroneal and  gastrocnemius veins when visible. The superficial great saphenous vein was also interrogated. Spectral Doppler was utilized to evaluate flow at rest and with distal augmentation maneuvers in the common femoral, femoral and popliteal veins.  COMPARISON: None.  FINDINGS: Contralateral Common Femoral Vein: Respiratory phasicity is normal and symmetric with the symptomatic side. No evidence of thrombus. Normal compressibility.  Common Femoral Vein: No evidence of thrombus. Normal compressibility, respiratory phasicity and response to augmentation.  Saphenofemoral Junction: No evidence of thrombus. Normal compressibility and flow on color Doppler imaging.  Profunda Femoral Vein: No evidence of thrombus. Normal compressibility and flow on color Doppler imaging.  Femoral Vein: Partial filling defect demonstrated in the distal superficial femoral vein consistent with nonocclusive thrombus.  Popliteal Vein: Filling defect demonstrated in the popliteal vein consistent with nonocclusive thrombus.  Calf Veins: No evidence of thrombus. Normal  compressibility and flow on color Doppler imaging.  Superficial Great Saphenous Vein: No evidence of thrombus. Normal compressibility and flow on color Doppler imaging.  Venous Reflux: None.  Other Findings: None.  IMPRESSION: Positive for deep venous thrombosis. Nonocclusive thrombus demonstrated in the distal left superficial femoral vein and in the popliteal vein.  These results were called by telephone at the time of interpretation on 10/08/2015 at 6:33 pm to Dr. Marcelene Butte, who verbally acknowledged these results.   Electronically Signed By: Lucienne Capers M.D. On: 10/08/2015 18:35          DG Chest 2 View (Final result) Result time: 10/08/15 17:59:33   Final result by Rad Results In Interface (10/08/15 17:59:33)   Narrative:   CLINICAL DATA: Shortness of Breath  EXAM: CHEST 2 VIEW  COMPARISON: 07/13/2015 and  report 09/21/2015  FINDINGS: Cardiomediastinal silhouette is stable. Persistent nodular density in left upper lobe measures 1.7 cm. Further correlation with PET scan is recommended to exclude malignancy. No acute infiltrate or pleural effusion. No pulmonary edema. Degenerative changes thoracic spine again noted.  IMPRESSION: Persistent nodular density in left upper lobe measures 1.7 cm. Further correlation with PET scan is recommended to exclude malignancy. No acute infiltrate or pleural effusion. No pulmonary edema.   Electronically Signed By: Lahoma Crocker M.D. On: 10/08/2015 17:59         I personally reviewed the radiologic studies      ED Course: Patient was found by Doppler study to have a left lower extremity DVT. He also clinically has an unusual presentation of left-sided cellulitis on the lower extremity in association with a DVT. Patient was given Lovenox here in emergency department sounds as though he has a lesion in his long which is most likely cancer. Patient's pulse ox remained stable here in emergency department otherwise hemodynamically stable was started on Lovenox injection for his DVT and does not have any evidence per CAT scan for a pulmonary embolism. He was initiated on vancomycin for his left lower extremity cellulitis. Case was reviewed with the hospitalist team for further inpatient management    Assessment:  Left lower extremity deep venous thrombosis Lung cancer Left lower extremity cellulitis   Final Clinical Impression:  Final diagnoses:  Shortness of breath     Plan: * Inpatient management            Daymon Larsen, MD 10/08/15 2318

## 2015-10-08 NOTE — Plan of Care (Signed)
Problem: Education: Goal: Knowledge of [facility] Education information/materials will improve Individualization: lives at home alone Cellulitis of bilateral lower ext New lung nodules- primary MD aware

## 2015-10-08 NOTE — ED Notes (Signed)
Patients O2 stats at 87%, placed on 2L of O2 Nasal Canula.

## 2015-10-09 LAB — CBC
HEMATOCRIT: 43.4 % (ref 40.0–52.0)
Hemoglobin: 14 g/dL (ref 13.0–18.0)
MCH: 30.7 pg (ref 26.0–34.0)
MCHC: 32.3 g/dL (ref 32.0–36.0)
MCV: 95 fL (ref 80.0–100.0)
PLATELETS: 215 10*3/uL (ref 150–440)
RBC: 4.56 MIL/uL (ref 4.40–5.90)
RDW: 14.7 % — AB (ref 11.5–14.5)
WBC: 9.4 10*3/uL (ref 3.8–10.6)

## 2015-10-09 LAB — BASIC METABOLIC PANEL
Anion gap: 7 (ref 5–15)
BUN: 15 mg/dL (ref 6–20)
CHLORIDE: 104 mmol/L (ref 101–111)
CO2: 32 mmol/L (ref 22–32)
CREATININE: 0.91 mg/dL (ref 0.61–1.24)
Calcium: 9.3 mg/dL (ref 8.9–10.3)
GFR calc Af Amer: >60 mL/min (ref >60)
GFR calc non Af Amer: >60 mL/min (ref >60)
GLUCOSE: 134 mg/dL — AB (ref 65–99)
POTASSIUM: 4.2 mmol/L (ref 3.5–5.1)
SODIUM: 143 mmol/L (ref 135–145)

## 2015-10-09 MED ORDER — METHYLPREDNISOLONE SODIUM SUCC 125 MG IJ SOLR
60.0000 mg | Freq: Four times a day (QID) | INTRAMUSCULAR | Status: DC
  Administered 2015-10-09 – 2015-10-11 (×9): 60 mg via INTRAVENOUS
  Filled 2015-10-09 (×9): qty 2

## 2015-10-09 MED ORDER — IPRATROPIUM-ALBUTEROL 0.5-2.5 (3) MG/3ML IN SOLN
3.0000 mL | RESPIRATORY_TRACT | Status: DC
  Administered 2015-10-09 – 2015-10-10 (×10): 3 mL via RESPIRATORY_TRACT
  Filled 2015-10-09 (×11): qty 3

## 2015-10-09 MED ORDER — LEVOFLOXACIN IN D5W 500 MG/100ML IV SOLN
500.0000 mg | INTRAVENOUS | Status: DC
  Administered 2015-10-09 – 2015-10-12 (×4): 500 mg via INTRAVENOUS
  Filled 2015-10-09 (×4): qty 100

## 2015-10-09 NOTE — Progress Notes (Addendum)
PT Cancellation Note  Patient Details Name: Kirk Hubbard MRN: [id number] DOB: 06/21/1923   Cancelled Treatment:    Reason Eval/Treat Not Completed: Other (comment) (nausea and low O2, issues of condition).   Will check later to see if pt feeling more agreeable.   Ramond Dial 10/09/2015, 11:46 AM   Mee Hives, PT MS Acute Rehab Dept. Number: [phone] [phone] and [place] [phone]

## 2015-10-09 NOTE — Plan of Care (Addendum)
No c/o pain, resting comfortably in chair, family at bedside for some of the day.  Problem: Safety: Goal: Ability to remain free from injury will improve Outcome: Progressing Pt is high falls, call bell within reach, bed & chair alarms in use, hourly safety rounds   Problem: Skin Integrity: Goal: Risk for impaired skin integrity will decrease Outcome: Progressing Skin care provided  Problem: Tissue Perfusion: Goal: Risk factors for ineffective tissue perfusion will decrease Outcome: Progressing VSS, afebrile, medication continues for DVT  Problem: Activity: Goal: Risk for activity intolerance will decrease Outcome: Progressing Pt sitting up in chair for most of the day, alarms in use

## 2015-10-09 NOTE — Progress Notes (Signed)
ANTIBIOTIC CONSULT NOTE - INITIAL  Pharmacy Consult for Levaquin Indication: pneumonia  No Known Allergies  Patient Measurements: Height: 6' (182.9 cm) Weight: 249 lb 6.4 oz (113.127 kg) IBW/kg (Calculated) : 77.6   Vital Signs: Temp: 97.6 F (36.4 C) (11/26 0506) Temp Source: Oral (11/26 0506) BP: 135/73 mmHg (11/26 0826) Pulse Rate: 106 (11/26 0826)  Labs:  Recent Labs  10/08/15 1727 10/09/15 0526  WBC 9.3 9.4  HGB 14.5 14.0  PLT 221 215  CREATININE 0.97 0.91   Estimated Creatinine Clearance: 70.1 mL/min (by C-G formula based on Cr of 0.91).   Microbiology: Recent Results (from the past 720 hour(s))  Culture, blood (routine x 2)     Status: None (Preliminary result)   Collection Time: 10/08/15  7:24 PM  Result Value Ref Range Status   Specimen Description BLOOD LEFT ANTECUBITAL  Final   Special Requests BOTTLES DRAWN AEROBIC AND ANAEROBIC 5CC  Final   Culture NO GROWTH < 12 HOURS  Final   Report Status PENDING  Incomplete  Culture, blood (routine x 2)     Status: None (Preliminary result)   Collection Time: 10/08/15  7:24 PM  Result Value Ref Range Status   Specimen Description BLOOD RIGHT ANTECUBITAL  Final   Special Requests BOTTLES DRAWN AEROBIC AND ANAEROBIC 6CC  Final   Culture NO GROWTH < 12 HOURS  Final   Report Status PENDING  Incomplete    Medical History: Past Medical History  Diagnosis Date  . Hypertension   . Hyperlipidemia   . DJD (degenerative joint disease)   . History of gout   . History of kidney stones   . History of prostate cancer   . Renal disorder     kidney stones  . Cancer ([place])   . CHF (congestive heart failure) ([facility])    Assessment: 81 yo patient admitted will acute DVT and bilateral leg cellulitis and acute hypoxia secondary to COPD exacerbation. Patient on Unasyn for cellulitis. Pharmacy consulted to dose and monitor levofloxacin for CAP.  According to MD process note patient has mucus plugging in lower lower lobes.     Plan:  Will start patient on levofloxacin 500mg  daily. Expected duration of treatment 5-7 days.  Pharmacy will continue to monitor patient renal function and labs and make adjustments as needed. Would consider IV-PO switch for 2nd dose.   Nancy Fetter, PharmD Pharmacy Resident 10/09/2015 9:11 AM

## 2015-10-09 NOTE — Progress Notes (Addendum)
[facility] NAME: Kirk Hubbard    MR#:  [id number]  DATE OF BIRTH:  06/21/1923  SUBJECTIVE: 81 year old male patient admitted for left leg DVT, left leg cellulitis. Started on Eliquis. And he is also on Unasyn. Today morning he had shortness of breath, hypoxia and oxygen saturations of 87% on room air so oxygen right now is at 4 L. wheezing present   CHIEF COMPLAINT:   Chief Complaint  Patient presents with  . Leg Swelling    REVIEW OF SYSTEMS:   ROS CONSTITUTIONAL: No fever, fatigue or weakness.  EYES: No blurred or double vision.  EARS, NOSE, AND THROAT: No tinnitus or ear pain.  RESPIRATORY: Shortness of breath, wheezing.  CARDIOVASCULAR: No chest pain, orthopnea, edema.  GASTROINTESTINAL: No nausea, vomiting, diarrhea or abdominal pain.  GENITOURINARY: No dysuria, hematuria.  ENDOCRINE: No polyuria, nocturia,  HEMATOLOGY: No anemia, easy bruising or bleeding SKIN: No rash or lesion. MUSCULOSKELETAL: Bilateral leg edema, redness in the left leg more.  NEUROLOGIC: No tingling, numbness, weakness.  PSYCHIATRY: No anxiety or depression.   DRUG ALLERGIES:  No Known Allergies  VITALS:  Blood pressure 135/73, pulse 106, temperature 97.6 F (36.4 C), temperature source Oral, resp. rate 18, height 6' (1.829 m), weight 113.127 kg (249 lb 6.4 oz), SpO2 93 %.  PHYSICAL EXAMINATION:  GENERAL:  81 y.o.-year-old patient lying in the bed with no acute distress.  EYES: Pupils equal, round, reactive to light and accommodation. No scleral icterus. Extraocular muscles intact.  HEENT: Head atraumatic, normocephalic. Oropharynx and nasopharynx clear.  NECK:  Supple, no jugular venous distention. No thyroid enlargement, no tenderness.  LUNGS: Expiratory wheeze in all lung fields. CARDIOVASCULAR: S1, S2 normal. No murmurs, rubs, or gallops.  ABDOMEN: Soft, nontender, nondistended. Bowel sounds present. No organomegaly or mass.   EXTREMITIES: Bilateral leg edema up to knees: Erythema of both legs .more on the left side. The numbness to palpation in the erythematous area of the left leg. NEUROLOGIC: Cranial nerves II through XII are intact. Muscle strength 5/5 in all extremities. Sensation intact. Gait not checked.  PSYCHIATRIC: The patient is alert and oriented x 3.  SKIN: No obvious rash, lesion, or ulcer.    LABORATORY PANEL:   CBC  Recent Labs Lab 10/09/15 0526  WBC 9.4  HGB 14.0  HCT 43.4  PLT 215   ------------------------------------------------------------------------------------------------------------------  Chemistries   Recent Labs Lab 10/08/15 1727 10/09/15 0526  NA 144 143  K 4.5 4.2  CL 103 104  CO2 33* 32  GLUCOSE 130* 134*  BUN 17 15  CREATININE 0.97 0.91  CALCIUM 9.7 9.3  AST 18  --   ALT 14*  --   ALKPHOS 92  --   BILITOT 0.7  --    ------------------------------------------------------------------------------------------------------------------  Cardiac Enzymes  Recent Labs Lab 10/07/15 1930  TROPONINI <0.03   ------------------------------------------------------------------------------------------------------------------  RADIOLOGY:  Dg Chest 2 View  10/08/2015  CLINICAL DATA:  Shortness of Breath EXAM: CHEST  2 VIEW COMPARISON:  07/13/2015 and report 09/21/2015 FINDINGS: Cardiomediastinal silhouette is stable. Persistent nodular density in left upper lobe measures 1.7 cm. Further correlation with PET scan is recommended to exclude malignancy. No acute infiltrate or pleural effusion. No pulmonary edema. Degenerative changes thoracic spine again noted. IMPRESSION: Persistent nodular density in left upper lobe measures 1.7 cm. Further correlation with PET scan is recommended to exclude malignancy. No acute infiltrate or pleural effusion. No pulmonary edema. Electronically Signed   By: Julien Girt  Pop M.D.   On: 10/08/2015 17:59   Ct Angio Chest Pe W/cm &/or Wo  Cm  10/08/2015  CLINICAL DATA:  Shortness of breath. Left lower leg pain and swelling. EXAM: CT ANGIOGRAPHY CHEST WITH CONTRAST TECHNIQUE: Multidetector CT imaging of the chest was performed using the standard protocol during bolus administration of intravenous contrast. Multiplanar CT image reconstructions and MIPs were obtained to evaluate the vascular anatomy. CONTRAST:  45mL OMNIPAQUE IOHEXOL 350 MG/ML SOLN COMPARISON:  10/08/2015; 07/13/2015 FINDINGS: Mediastinum/Nodes: Extensive motion artifact during scanning of lung bases obscures a portion of the pulmonary arterial tree. With this limitation in mind, No filling defect is identified in the pulmonary arterial tree to suggest pulmonary embolus. Coronary, aortic arch, and branch vessel atherosclerotic vascular disease. No aortic dissection or acute aortic findings. There is some calcification of the aortic valve leaflets. Mild cardiomegaly is present. A somewhat heterogeneous nodular right paratracheal density measures 1.9 by 2.6 cm on image 21 of series 4 and previously measured the same on 07/13/2015. This has a small stalk like connection to the thyroid gland and could well represent a thyroid nodule, with pathologic adenopathy as a less likely differential diagnostic consideration given the lack of any adenopathy elsewhere in the chest. Lungs/Pleura: Airway thickening is present, suggesting bronchitis or reactive airways disease. 3.2 by 2.1 cm sub solid lesion along the right hilum, stable. Stable scarring in the right middle lobe. Considerable airway plugging and airway thickening causing luminal occlusion in the lower lobe tracheobronchial tree. 3 mm apical posterior segment left upper lobe nodule, image 22 series 6. Faint 1.3 by 0.8 cm sub solid nodule anteriorly in the left upper lobe on image 34 series 6, not well seen on the prior exam. Laterally in the left upper lobe there is a partially sub solid 2.9 by 2.0 cm nodule, previously 2.6 by 1.8 cm,  currently on image 55 series 8. Upper abdomen: Partial visualization of a cystic lesion of the right kidney upper pole. Musculoskeletal: Thoracic spondylosis. Slight irregularity posteriorly in the T1 vertebral body with some underlying sclerosis and lucency, involved region approximately 1.6 cm, nonspecific. Review of the MIP images confirms the above findings. IMPRESSION: 1. The left upper lobe nodule persists and is slightly larger than on the August exam. Although partially sub solid, this lesion has a solid portion that is greater than 5 mm in size. Based on current guidelines, biopsy or surgical resection is recommended due to significant risk of adenocarcinoma. This recommendation follows the consensus statement: Recommendations for the Management of Subsolid Pulmonary Nodules Detected at CT: A Statement from the [employer] as published in Radiology 2013; 266:304-317. 2. In addition, there is a 3.2 by 2.1 cm sub solid mass in the right perihilar region which is persistent and concerning for malignancy, although more purely sub solid without a significant solid component. 3. 1.3 by 0.8 cm faintly sub solid nodule anteriorly in the left upper lobe also merits observation, but was not well seen on the prior exam. 4. No embolus is identified. Please note that portions of the lung bases are completely obscured by motion artifact, Cecille Rubin negative predictive value of today' s exam. 5. Coronary, aortic arch, and branch vessel atherosclerotic vascular disease. Mild cardiomegaly. 6. Airway thickening, especially severe in the lower lobes, with some airway plugging in the lower lobes. 7. Stable 1.9 by 2.6 cm nodular right paratracheal density in the upper paratracheal range, somewhat heterogeneous. This seems to have a small stalk like connection to the thyroid gland and  is probably a thyroid nodule, less likely to be pathologic paratracheal adenopathy. 8. Nonspecific mixed sclerosis and lucency posteriorly in the  T1 vertebral body, could be a hemangioma but I cannot exclude malignancy. Electronically Signed   By: Van Clines M.D.   On: 10/08/2015 20:27   US Venous Img Lower Unilateral Left  10/08/2015  CLINICAL DATA:  Swelling and redness in the calf.  Previous DVTs. EXAM: Left LOWER EXTREMITY VENOUS DOPPLER ULTRASOUND TECHNIQUE: Gray-scale sonography with graded compression, as well as color Doppler and duplex ultrasound were performed to evaluate the lower extremity deep venous systems from the level of the common femoral vein and including the common femoral, femoral, profunda femoral, popliteal and calf veins including the posterior tibial, peroneal and gastrocnemius veins when visible. The superficial great saphenous vein was also interrogated. Spectral Doppler was utilized to evaluate flow at rest and with distal augmentation maneuvers in the common femoral, femoral and popliteal veins. COMPARISON:  None. FINDINGS: Contralateral Common Femoral Vein: Respiratory phasicity is normal and symmetric with the symptomatic side. No evidence of thrombus. Normal compressibility. Common Femoral Vein: No evidence of thrombus. Normal compressibility, respiratory phasicity and response to augmentation. Saphenofemoral Junction: No evidence of thrombus. Normal compressibility and flow on color Doppler imaging. Profunda Femoral Vein: No evidence of thrombus. Normal compressibility and flow on color Doppler imaging. Femoral Vein: Partial filling defect demonstrated in the distal superficial femoral vein consistent with nonocclusive thrombus. Popliteal Vein: Filling defect demonstrated in the popliteal vein consistent with nonocclusive thrombus. Calf Veins: No evidence of thrombus. Normal compressibility and flow on color Doppler imaging. Superficial Great Saphenous Vein: No evidence of thrombus. Normal compressibility and flow on color Doppler imaging. Venous Reflux:  None. Other Findings:  None. IMPRESSION: Positive for deep  venous thrombosis. Nonocclusive thrombus demonstrated in the distal left superficial femoral vein and in the popliteal vein. These results were called by telephone at the time of interpretation on 10/08/2015 at 6:33 pm to Dr. Marcelene Butte, who verbally acknowledged these results. Electronically Signed   By: Lucienne Capers M.D.   On: 10/08/2015 18:35    EKG:   Orders placed or performed during the hospital encounter of 10/08/15  . ED EKG  . ED EKG  . EKG 12-Lead  . EKG 12-Lead    ASSESSMENT AND PLAN:  #1 acute left leg DVT:;continue ELiquis;CT chest negative or PE 2.Bilateral Leg cellulitis; continue Unasyn, follow clinical course, continue Lasix for leg edema #3. Acute hypoxia secondary to COPD exacerbation: Continue oxygen to keep saturations more than 90, start Solu-Medrol, duo nebulizers. #4 . Left upper lobe lung nodule: Follows up with Dr. Raul Del as an outpatient getting workup done for possible malignancy and biopsy #5 positive and mucous plugging in lower lobes; start chest physical therapy, continue nebulizers, add Levaquin, pulmonary consult.   All the records are reviewed and case discussed with Care Management/Social Workerr. Management plans discussed with the patient, family and they are in agreement.  CODE STATUS: DO NOT RESUSCITATE  TOTAL TIME TAKING CARE OF THIS PATIENT: 35 minutes.   POSSIBLE D/C IN  1-2DAYS, DEPENDING ON CLINICAL CONDITION.  On eliquis;will inform Dr.oaks about possible PET scan and biopsy. Epifanio Lesches M.D on 10/09/2015 at 8:34 AM  Between 7am to 6pm - Pager - [phone]  After 6pm go to [email or web address] - password [email or web address] [facility]  Office  [phone]  CC: Primary care physician; Tracie Harrier, MD   Note: This dictation was prepared with Dragon dictation along with smaller phrase technology. Any  transcriptional errors that result from this process are unintentional.

## 2015-10-09 NOTE — Plan of Care (Signed)
Problem: Education: Goal: Knowledge of [facility] Education information/materials will improve Outcome: Progressing Pt alert and oriented, yet forgetful at times  Problem: Safety: Goal: Ability to remain free from injury will improve Outcome: Progressing High fall risk-no attempts to get up  Problem: Skin Integrity: Goal: Risk for impaired skin integrity will decrease Outcome: Not Progressing Cellulitis to bilateral lower ext  Problem: Tissue Perfusion: Goal: Risk factors for ineffective tissue perfusion will decrease Outcome: Not Progressing DVT to Left lower leg

## 2015-10-10 MED ORDER — IPRATROPIUM-ALBUTEROL 0.5-2.5 (3) MG/3ML IN SOLN
3.0000 mL | Freq: Four times a day (QID) | RESPIRATORY_TRACT | Status: DC
  Administered 2015-10-11 – 2015-10-12 (×6): 3 mL via RESPIRATORY_TRACT
  Filled 2015-10-10 (×7): qty 3

## 2015-10-10 MED ORDER — HYDROCOD POLST-CPM POLST ER 10-8 MG/5ML PO SUER
5.0000 mL | Freq: Two times a day (BID) | ORAL | Status: DC | PRN
  Administered 2015-10-10: 5 mL via ORAL
  Filled 2015-10-10: qty 5

## 2015-10-10 NOTE — Plan of Care (Signed)
Problem: Safety: Goal: Ability to remain free from injury will improve Outcome: Progressing Pt is a high fall risk.  He remained in bed this shift.  Bed in lowest position with wheels locked and call bell within reach.  Bed alarm on throughout shift.  Hourly rounds to assess for needs.  Patient free from injury.  Problem: Skin Integrity: Goal: Risk for impaired skin integrity will decrease Outcome: Progressing Patient repositioned every two hours throughout shift.  Heels floated and foot of bed elevated.  Problem: Tissue Perfusion: Goal: Risk factors for ineffective tissue perfusion will decrease Outcome: Progressing VSS, afebrile, medication continues for DVT.  Patient with edema of +3 and +4 to lower extremities.  BP 110/57 mmHg  Pulse 102  Temp(Src) 98 F (36.7 C) (Oral)  Resp 20  Ht 6' (1.829 m)  Wt 249 lb 6.4 oz (113.127 kg)  BMI 33.82 kg/m2  SpO2 95%

## 2015-10-10 NOTE — Progress Notes (Signed)
Spoke with Dr. Vianne Bulls on the fact that the Chest PT being performed isn't mobilizing any secretions.  Worked with patient this morning using the flutter valve and how this seemed to mobilize secretions and produce a cough for the patient.  Dr. Vianne Bulls stated will be fine to  switch to the flutter valve at this time.

## 2015-10-10 NOTE — Evaluation (Signed)
Physical Therapy Evaluation Patient Details Name: Kirk Hubbard MRN: [id number] DOB: 06/21/1923 Today's Date: 10/10/2015   History of Present Illness  Pt here with LE cellulitis, had (+) L DVT  Clinical Impression  Pt is somewhat impulsive and difficult to keep on task t/o session, but ultimately does well and is able to ambulate near his baseline, has no pain and is appears he will be safe a home with near 24 hr supervision.     Follow Up Recommendations Home health PT    Equipment Recommendations       Recommendations for Other Services       Precautions / Restrictions Precautions Precautions: Fall Restrictions Weight Bearing Restrictions: No      Mobility  Bed Mobility Overal bed mobility: Needs Assistance Bed Mobility: Supine to Sit     Supine to sit: Min assist     General bed mobility comments: Pt needs only minimal cuing/assist to get sitting EOB  Transfers Overall transfer level: Modified independent Equipment used: Straight cane             General transfer comment: Pt is minimally impulsive, but is actaully able to get to standing w/o direct assist  Ambulation/Gait Ambulation/Gait assistance: Min guard Ambulation Distance (Feet): 20 Feet Assistive device: Straight cane       General Gait Details: slow, shuffling gait though pt reports that this is his baseline and feels like he will be sate with assist from family at home  Stairs            Wheelchair Mobility    Modified Rankin (Stroke Patients Only)       Balance                                             Pertinent Vitals/Pain Pain Assessment: No/denies pain    Home Living Family/patient expects to be discharged to:: Private residence Living Arrangements: Alone Available Help at Discharge: Family (reports he would have 24 hour assist if needed) Type of Home: House Home Access: Stairs to enter Entrance Stairs-Rails: Can reach both Entrance Stairs-Number  of Steps: 2          Prior Function Level of Independence: Independent with assistive device(s)         Comments: uses a cane at baseline, reports he is able to be relativeely active     Hand Dominance        Extremity/Trunk Assessment   Upper Extremity Assessment: Generalized weakness           Lower Extremity Assessment: Generalized weakness;Overall WFL for tasks assessed         Communication   Communication: No difficulties  Cognition Arousal/Alertness: Awake/alert Behavior During Therapy: WFL for tasks assessed/performed Overall Cognitive Status: Within Functional Limits for tasks assessed                      General Comments      Exercises        Assessment/Plan    PT Assessment Patient needs continued PT services  PT Diagnosis Difficulty walking;Generalized weakness   PT Problem List Decreased strength;Decreased activity tolerance;Decreased balance;Decreased mobility;Decreased coordination;Decreased knowledge of use of DME;Decreased cognition;Decreased safety awareness  PT Treatment Interventions Gait training;Stair training;DME instruction;Functional mobility training;Therapeutic activities;Therapeutic exercise;Balance training;Neuromuscular re-education   PT Goals (Current goals can be found in the Care Plan section) Acute  Rehab PT Goals Patient Stated Goal: to go home PT Goal Formulation: With patient Time For Goal Achievement: 10/24/15 Potential to Achieve Goals: Good    Frequency Min 2X/week   Barriers to discharge        Co-evaluation               End of Session Equipment Utilized During Treatment: Gait belt Activity Tolerance: Patient limited by fatigue Patient left: with chair alarm set;with call bell/phone within reach           Time: 0920-0933 PT Time Calculation (min) (ACUTE ONLY): 13 min   Charges:   PT Evaluation $Initial PT Evaluation Tier I: 1 Procedure     PT G Codes:       Wayne Both,  PT, DPT [phone]  Kreg Shropshire 10/10/2015, 11:35 AM

## 2015-10-10 NOTE — Progress Notes (Signed)
[facility] [facility] [place] NAME: Kirk Hubbard    MR#:  [id number]  DATE OF BIRTH:  06/21/1923  SUBJECTIVE: 81 year old male patient admitted for left leg DVT, left leg cellulitis. Started on Eliquis. And he is also on Unasyn. Today morning he had shortness of breath, hypoxia and oxygen saturations of 87% on room air so oxygen right now is at 4 L. wheezing present   CHIEF COMPLAINT:   Chief Complaint  Patient presents with  . Leg Swelling    REVIEW OF SYSTEMS:   Review of Systems  Constitutional: Negative for fever and chills.  HENT: Negative for hearing loss.   Eyes: Negative for blurred vision, double vision and photophobia.  Respiratory: Positive for cough and shortness of breath. Negative for hemoptysis.   Cardiovascular: Negative for palpitations, orthopnea and leg swelling.  Gastrointestinal: Negative for vomiting, abdominal pain and diarrhea.  Genitourinary: Negative for dysuria and urgency.  Musculoskeletal: Negative for myalgias and neck pain.       Decreased  redness, leg swelling.  Skin: Negative for rash.  Neurological: Negative for dizziness, focal weakness, seizures, weakness and headaches.  Psychiatric/Behavioral: Negative for memory loss. The patient does not have insomnia.       DRUG ALLERGIES:  No Known Allergies  VITALS:  Blood pressure 122/54, pulse 111, temperature 98.4 F (36.9 C), temperature source Oral, resp. rate 20, height 6' (1.829 m), weight 113.127 kg (249 lb 6.4 oz), SpO2 93 %.  PHYSICAL EXAMINATION:  GENERAL:  81 y.o.-year-old patient lying in the bed with no acute distress.  EYES: Pupils equal, round, reactive to light and accommodation. No scleral icterus. Extraocular muscles intact.  HEENT: Head atraumatic, normocephalic. Oropharynx and nasopharynx clear.  NECK:  Supple, no jugular venous distention. No thyroid enlargement, no tenderness.  LUNGS: Expiratory wheeze in all lung fields. Better than  yesterday.  CARDIOVASCULAR: S1, S2 normal. No murmurs, rubs, or gallops.  ABDOMEN: Soft, nontender, nondistended. Bowel sounds present. No organomegaly or mass.  EXTREMITIES erythem and edema of the legs improving.  NEUROLOGIC: Cranial nerves II through XII are intact. Muscle strength 5/5 in all extremities. Sensation intact. Gait not checked.  PSYCHIATRIC: The patient is alert and oriented x 3.  SKIN: No obvious rash, lesion, or ulcer.    LABORATORY PANEL:   CBC  Recent Labs Lab 10/09/15 0526  WBC 9.4  HGB 14.0  HCT 43.4  PLT 215   ------------------------------------------------------------------------------------------------------------------  Chemistries   Recent Labs Lab 10/08/15 1727 10/09/15 0526  NA 144 143  K 4.5 4.2  CL 103 104  CO2 33* 32  GLUCOSE 130* 134*  BUN 17 15  CREATININE 0.97 0.91  CALCIUM 9.7 9.3  AST 18  --   ALT 14*  --   ALKPHOS 92  --   BILITOT 0.7  --    ------------------------------------------------------------------------------------------------------------------  Cardiac Enzymes  Recent Labs Lab 10/07/15 1930  TROPONINI <0.03   ------------------------------------------------------------------------------------------------------------------  RADIOLOGY:  Dg Chest 2 View  10/08/2015  CLINICAL DATA:  Shortness of Breath EXAM: CHEST  2 VIEW COMPARISON:  07/13/2015 and report 09/21/2015 FINDINGS: Cardiomediastinal silhouette is stable. Persistent nodular density in left upper lobe measures 1.7 cm. Further correlation with PET scan is recommended to exclude malignancy. No acute infiltrate or pleural effusion. No pulmonary edema. Degenerative changes thoracic spine again noted. IMPRESSION: Persistent nodular density in left upper lobe measures 1.7 cm. Further correlation with PET scan is recommended to exclude malignancy. No acute infiltrate or pleural effusion.  No pulmonary edema. Electronically Signed   By: Lahoma Crocker M.D.   On:  10/08/2015 17:59   Ct Angio Chest Pe W/cm &/or Wo Cm  10/08/2015  CLINICAL DATA:  Shortness of breath. Left lower leg pain and swelling. EXAM: CT ANGIOGRAPHY CHEST WITH CONTRAST TECHNIQUE: Multidetector CT imaging of the chest was performed using the standard protocol during bolus administration of intravenous contrast. Multiplanar CT image reconstructions and MIPs were obtained to evaluate the vascular anatomy. CONTRAST:  51mL OMNIPAQUE IOHEXOL 350 MG/ML SOLN COMPARISON:  10/08/2015; 07/13/2015 FINDINGS: Mediastinum/Nodes: Extensive motion artifact during scanning of lung bases obscures a portion of the pulmonary arterial tree. With this limitation in mind, No filling defect is identified in the pulmonary arterial tree to suggest pulmonary embolus. Coronary, aortic arch, and branch vessel atherosclerotic vascular disease. No aortic dissection or acute aortic findings. There is some calcification of the aortic valve leaflets. Mild cardiomegaly is present. A somewhat heterogeneous nodular right paratracheal density measures 1.9 by 2.6 cm on image 21 of series 4 and previously measured the same on 07/13/2015. This has a small stalk like connection to the thyroid gland and could well represent a thyroid nodule, with pathologic adenopathy as a less likely differential diagnostic consideration given the lack of any adenopathy elsewhere in the chest. Lungs/Pleura: Airway thickening is present, suggesting bronchitis or reactive airways disease. 3.2 by 2.1 cm sub solid lesion along the right hilum, stable. Stable scarring in the right middle lobe. Considerable airway plugging and airway thickening causing luminal occlusion in the lower lobe tracheobronchial tree. 3 mm apical posterior segment left upper lobe nodule, image 22 series 6. Faint 1.3 by 0.8 cm sub solid nodule anteriorly in the left upper lobe on image 34 series 6, not well seen on the prior exam. Laterally in the left upper lobe there is a partially sub  solid 2.9 by 2.0 cm nodule, previously 2.6 by 1.8 cm, currently on image 55 series 8. Upper abdomen: Partial visualization of a cystic lesion of the right kidney upper pole. Musculoskeletal: Thoracic spondylosis. Slight irregularity posteriorly in the T1 vertebral body with some underlying sclerosis and lucency, involved region approximately 1.6 cm, nonspecific. Review of the MIP images confirms the above findings. IMPRESSION: 1. The left upper lobe nodule persists and is slightly larger than on the August exam. Although partially sub solid, this lesion has a solid portion that is greater than 5 mm in size. Based on current guidelines, biopsy or surgical resection is recommended due to significant risk of adenocarcinoma. This recommendation follows the consensus statement: Recommendations for the Management of Subsolid Pulmonary Nodules Detected at CT: A Statement from the [employer] as published in Radiology 2013; 266:304-317. 2. In addition, there is a 3.2 by 2.1 cm sub solid mass in the right perihilar region which is persistent and concerning for malignancy, although more purely sub solid without a significant solid component. 3. 1.3 by 0.8 cm faintly sub solid nodule anteriorly in the left upper lobe also merits observation, but was not well seen on the prior exam. 4. No embolus is identified. Please note that portions of the lung bases are completely obscured by motion artifact, Cecille Rubin negative predictive value of today' s exam. 5. Coronary, aortic arch, and branch vessel atherosclerotic vascular disease. Mild cardiomegaly. 6. Airway thickening, especially severe in the lower lobes, with some airway plugging in the lower lobes. 7. Stable 1.9 by 2.6 cm nodular right paratracheal density in the upper paratracheal range, somewhat heterogeneous. This seems to have  a small stalk like connection to the thyroid gland and is probably a thyroid nodule, less likely to be pathologic paratracheal adenopathy. 8.  Nonspecific mixed sclerosis and lucency posteriorly in the T1 vertebral body, could be a hemangioma but I cannot exclude malignancy. Electronically Signed   By: Van Clines M.D.   On: 10/08/2015 20:27   US Venous Img Lower Unilateral Left  10/08/2015  CLINICAL DATA:  Swelling and redness in the calf.  Previous DVTs. EXAM: Left LOWER EXTREMITY VENOUS DOPPLER ULTRASOUND TECHNIQUE: Gray-scale sonography with graded compression, as well as color Doppler and duplex ultrasound were performed to evaluate the lower extremity deep venous systems from the level of the common femoral vein and including the common femoral, femoral, profunda femoral, popliteal and calf veins including the posterior tibial, peroneal and gastrocnemius veins when visible. The superficial great saphenous vein was also interrogated. Spectral Doppler was utilized to evaluate flow at rest and with distal augmentation maneuvers in the common femoral, femoral and popliteal veins. COMPARISON:  None. FINDINGS: Contralateral Common Femoral Vein: Respiratory phasicity is normal and symmetric with the symptomatic side. No evidence of thrombus. Normal compressibility. Common Femoral Vein: No evidence of thrombus. Normal compressibility, respiratory phasicity and response to augmentation. Saphenofemoral Junction: No evidence of thrombus. Normal compressibility and flow on color Doppler imaging. Profunda Femoral Vein: No evidence of thrombus. Normal compressibility and flow on color Doppler imaging. Femoral Vein: Partial filling defect demonstrated in the distal superficial femoral vein consistent with nonocclusive thrombus. Popliteal Vein: Filling defect demonstrated in the popliteal vein consistent with nonocclusive thrombus. Calf Veins: No evidence of thrombus. Normal compressibility and flow on color Doppler imaging. Superficial Great Saphenous Vein: No evidence of thrombus. Normal compressibility and flow on color Doppler imaging. Venous Reflux:   None. Other Findings:  None. IMPRESSION: Positive for deep venous thrombosis. Nonocclusive thrombus demonstrated in the distal left superficial femoral vein and in the popliteal vein. These results were called by telephone at the time of interpretation on 10/08/2015 at 6:33 pm to Dr. Marcelene Butte, who verbally acknowledged these results. Electronically Signed   By: Lucienne Capers M.D.   On: 10/08/2015 18:35    EKG:   Orders placed or performed during the hospital encounter of 10/08/15  . ED EKG  . ED EKG  . EKG 12-Lead  . EKG 12-Lead    ASSESSMENT AND PLAN:  #1 acute left leg DVT:;continue ELiquis;CT chest negative or PE 2.Bilateral Leg cellulitis; clinically improving. continue Unasyn, follow clinical course, continue Lasix for leg edema #3. Acute hypoxia secondary to COPD exacerbation: Continue oxygen to keep saturations more than 90, start Solu-Medrol, duo nebulizers. Has been having these episodes multiple times recently #4 . Left upper lobe lung nodule: Follows up with Dr. Raul Del as an outpatient getting workup done for possible malignancy and biopsy, waiting for insurance authorization. Discussed with the son yesterday and also looked at 'care everywhere.' We'll inform Dr. Genevive Bi about use of atelectasis at this time due to leg DVT. #5 positive and mucous plugging in lower lobes; start chest physical therapy, continue nebulizers, add Levaquin, pulmonary consult.   All the records are reviewed and case discussed with Care Management/Social Workerr. Management plans discussed with the patient, family and they are in agreement.  CODE STATUS: DO NOT RESUSCITATE  TOTAL TIME TAKING CARE OF THIS PATIENT: 35 minutes.   POSSIBLE D/C IN  1-2DAYS, DEPENDING ON CLINICAL CONDITION.  Epifanio Lesches M.D on 10/10/2015 at 9:28 AM  Between 7am to 6pm - Pager - [phone]  After 6pm go to [email or web address] - password [email or web address]  Office  [phone]  CC: Primary  care physician; Tracie Harrier, MD   Note: This dictation was prepared with Dragon dictation along with smaller phrase technology. Any transcriptional errors that result from this process are unintentional.

## 2015-10-10 NOTE — Plan of Care (Signed)
Problem: Education: Goal: Knowledge of [facility] Education information/materials will improve Outcome: Progressing Discussed with pt about the plan of care. Educated the pt about high fall risk precautions. Pt verbalized understanding.  Problem: Safety: Goal: Ability to remain free from injury will improve Outcome: Progressing High fall risk precautions maintained. Pt remained free of Injury during the shift.  Problem: Skin Integrity: Goal: Risk for impaired skin integrity will decrease Outcome: Progressing Pt was up to the chair today. Stood up several times with assist with assist. Repositioned qx2hrs. Heels kept elevated.  Problem: Tissue Perfusion: Goal: Risk factors for ineffective tissue perfusion will decrease Outcome: Progressing VSS. Denies pain. Tussionex given once for coughing. RLE +3 edema, LLE +4edema, pedal pulses present, No weeping noted, redness improving.

## 2015-10-10 NOTE — Progress Notes (Signed)
Pt performed flutter valve with proper technique and had productive cough

## 2015-10-11 MED ORDER — DOCUSATE SODIUM 100 MG PO CAPS
100.0000 mg | ORAL_CAPSULE | Freq: Every day | ORAL | Status: DC
  Administered 2015-10-11 – 2015-10-12 (×2): 100 mg via ORAL
  Filled 2015-10-11 (×2): qty 1

## 2015-10-11 MED ORDER — METHYLPREDNISOLONE SODIUM SUCC 40 MG IJ SOLR
40.0000 mg | Freq: Four times a day (QID) | INTRAMUSCULAR | Status: DC
  Administered 2015-10-11 – 2015-10-12 (×5): 40 mg via INTRAVENOUS
  Filled 2015-10-11 (×5): qty 1

## 2015-10-11 MED ORDER — SENNA 8.6 MG PO TABS
1.0000 | ORAL_TABLET | Freq: Every day | ORAL | Status: DC
  Administered 2015-10-11 – 2015-10-12 (×2): 8.6 mg via ORAL
  Filled 2015-10-11 (×2): qty 1

## 2015-10-11 MED ORDER — APIXABAN 5 MG PO TABS
10.0000 mg | ORAL_TABLET | Freq: Two times a day (BID) | ORAL | Status: DC
  Administered 2015-10-11 – 2015-10-12 (×2): 10 mg via ORAL
  Filled 2015-10-11 (×3): qty 2

## 2015-10-11 NOTE — Plan of Care (Signed)
Problem: Safety: Goal: Ability to remain free from injury will improve Outcome: Progressing High fall risk precautions maintained. Pt remained free of injury.  Problem: Skin Integrity: Goal: Risk for impaired skin integrity will decrease Outcome: Progressing  Redness improving in bilateral lower extremities. Heels kept elevated.  Problem: Tissue Perfusion: Goal: Risk factors for ineffective tissue perfusion will decrease Outcome: Progressing Eliquis for DVT continues. Repositioned qx2hrs.  Problem: Activity: Goal: Risk for activity intolerance will decrease Outcome: Progressing Pt up to the chair today. Standing with a cane and assist. Continue on 4L O2 per [place] with O2 sats in the mid 90's.

## 2015-10-11 NOTE — Care Management Important Message (Signed)
Important Message  Patient Details  Name: Kirk Hubbard MRN: [id number] Date of Birth: 06/21/1923   Medicare Important Message Given:  Yes    Juliann Pulse A Jenner Rosier 10/11/2015, 11:15 AM

## 2015-10-11 NOTE — Plan of Care (Addendum)
No c/o pain, resting comfortably. VSS, afebrile. IV Abx given as scheduled. Will continue to assess.   Problem: Safety: Goal: Ability to remain free from injury will improve Outcome: Progressing High fall risk. Bed alarm on, hourly rounding w/ toileting offered. Pt understands how to use call system for assistance. Safe environment provided.  Problem: Skin Integrity: Goal: Risk for impaired skin integrity will decrease Outcome: Progressing Left buttocks skin tear w/ foam for protection. Bilateral lower extremity edema- legs elevated. Left leg w/ DVT & cellulitis improving. Skin care provided.  Problem: Tissue Perfusion: Goal: Risk factors for ineffective tissue perfusion will decrease Outcome: Progressing Pt refuses SCD's. Eliquis given for DVT.  Problem: Activity: Goal: Risk for activity intolerance will decrease Outcome: Progressing +1 assist w/ cane w/ rest periods provided. Remains on 4L O2 w/ sats mid 90s

## 2015-10-11 NOTE — Progress Notes (Signed)
[facility] at [place] NAME: Kirk Hubbard    MR#:  [id number]  DATE OF BIRTH:  06/21/1923  SUBJECTIVE: 81 year old male patient admitted for left leg DVT, left leg cellulitis. Started on Eliquis. And he is also on Unasyn. Still wheezing.   CHIEF COMPLAINT:   Chief Complaint  Patient presents with  . Leg Swelling    REVIEW OF SYSTEMS:   Review of Systems  Constitutional: Negative for fever and chills.  HENT: Negative for hearing loss.   Eyes: Negative for blurred vision, double vision and photophobia.  Respiratory: Positive for cough, shortness of breath and wheezing. Negative for hemoptysis.   Cardiovascular: Negative for palpitations, orthopnea and leg swelling.  Gastrointestinal: Negative for vomiting, abdominal pain and diarrhea.  Genitourinary: Negative for dysuria and urgency.  Musculoskeletal: Negative for myalgias and neck pain.       Decreased  redness, leg swelling.  Skin: Negative for rash.  Neurological: Negative for dizziness, focal weakness, seizures, weakness and headaches.  Psychiatric/Behavioral: Negative for memory loss. The patient does not have insomnia.       DRUG ALLERGIES:  No Known Allergies  VITALS:  Blood pressure 118/60, pulse 99, temperature 97.4 F (36.3 C), temperature source Oral, resp. rate 18, height 6' (1.829 m), weight 113.127 kg (249 lb 6.4 oz), SpO2 95 %.  PHYSICAL EXAMINATION:  GENERAL:  81 y.o.-year-old patient lying in the bed with no acute distress.  EYES: Pupils equal, round, reactive to light and accommodation. No scleral icterus. Extraocular muscles intact.  HEENT: Head atraumatic, normocephalic. Oropharynx and nasopharynx clear.  NECK:  Supple, no jugular venous distention. No thyroid enlargement, no tenderness.  LUNGS: Expiratory wheeze in all lung fields. Better than yesterday.  CARDIOVASCULAR: S1, S2 normal. No murmurs, rubs, or gallops.  ABDOMEN: Soft, nontender, nondistended.  Bowel sounds present. No organomegaly or mass.  EXTREMITIES erythem and edema of the legs improving.  NEUROLOGIC: Cranial nerves II through XII are intact. Muscle strength 5/5 in all extremities. Sensation intact. Gait not checked.  PSYCHIATRIC: The patient is alert and oriented x 3.  SKIN: No obvious rash, lesion, or ulcer.    LABORATORY PANEL:   CBC  Recent Labs Lab 10/09/15 0526  WBC 9.4  HGB 14.0  HCT 43.4  PLT 215   ------------------------------------------------------------------------------------------------------------------  Chemistries   Recent Labs Lab 10/08/15 1727 10/09/15 0526  NA 144 143  K 4.5 4.2  CL 103 104  CO2 33* 32  GLUCOSE 130* 134*  BUN 17 15  CREATININE 0.97 0.91  CALCIUM 9.7 9.3  AST 18  --   ALT 14*  --   ALKPHOS 92  --   BILITOT 0.7  --    ------------------------------------------------------------------------------------------------------------------  Cardiac Enzymes  Recent Labs Lab 10/07/15 1930  TROPONINI <0.03   ------------------------------------------------------------------------------------------------------------------  RADIOLOGY:  No results found.  EKG:   Orders placed or performed during the hospital encounter of 10/08/15  . ED EKG  . ED EKG  . EKG 12-Lead  . EKG 12-Lead    ASSESSMENT AND PLAN:  #1 acute left leg DVT:;continue ELiquis;CT chest negative or PE 2.Bilateral Leg cellulitis; clinically improving. continue Unasyn, follow clinical course, continue Lasix for leg edema #3. Acute hypoxia secondary to COPD exacerbation: Continue oxygen to keep saturations more than 90, start Solu-Medrol, duo nebulizers. Has been having these episodes multiple times recently #4 . Left upper lobe lung nodule: Follows up with Dr. Raul Del as an outpatient getting workup done for possible malignancy and  biopsy, waiting for insurance authorization. Discussed with the son yesterday and also looked at 'care everywhere.' We'll  inform Dr. Genevive Bi about use of atelectasis at this time due to leg DVT. #5 positive and mucous plugging in lower lobes; start chest physical therapy, continue nebulizers, add Levaquin,  likley  discharge tomorrow. All the records are reviewed and case discussed with Care Management/Social Workerr. Management plans discussed with the patient, family and they are in agreement.  CODE STATUS: DO NOT RESUSCITATE  TOTAL TIME TAKING CARE OF THIS PATIENT: 35 minutes.   POSSIBLE D/C IN  1-2DAYS, DEPENDING ON CLINICAL CONDITION.  Epifanio Lesches M.D on 10/11/2015 at 11:46 AM  Between 7am to 6pm - Pager - [phone]  After 6pm go to [email or web address] - password [email or web address] [facility]  Office  [phone]  CC: Primary care physician; Tracie Harrier, MD   Note: This dictation was prepared with Dragon dictation along with smaller phrase technology. Any transcriptional errors that result from this process are unintentional.

## 2015-10-12 LAB — CBC WITH DIFFERENTIAL/PLATELET
BASOS PCT: 0 %
Basophils Absolute: 0 10*3/uL (ref 0–0.1)
EOS ABS: 0 10*3/uL (ref 0–0.7)
EOS PCT: 0 %
HEMATOCRIT: 39.4 % — AB (ref 40.0–52.0)
Hemoglobin: 13 g/dL (ref 13.0–18.0)
Lymphocytes Relative: 4 %
Lymphs Abs: 0.5 10*3/uL — ABNORMAL LOW (ref 1.0–3.6)
MCH: 30.7 pg (ref 26.0–34.0)
MCHC: 32.9 g/dL (ref 32.0–36.0)
MCV: 93.4 fL (ref 80.0–100.0)
MONO ABS: 0.3 10*3/uL (ref 0.2–1.0)
MONOS PCT: 2 %
NEUTROS ABS: 11.5 10*3/uL — AB (ref 1.4–6.5)
Neutrophils Relative %: 94 %
Platelets: 185 10*3/uL (ref 150–440)
RBC: 4.22 MIL/uL — ABNORMAL LOW (ref 4.40–5.90)
RDW: 14.8 % — AB (ref 11.5–14.5)
WBC: 12.3 10*3/uL — ABNORMAL HIGH (ref 3.8–10.6)

## 2015-10-12 LAB — CREATININE, SERUM
Creatinine, Ser: 1.1 mg/dL (ref 0.61–1.24)
GFR calc Af Amer: >60 mL/min (ref >60)
GFR, EST NON AFRICAN AMERICAN: 57 mL/min — AB (ref >60)

## 2015-10-12 MED ORDER — IPRATROPIUM-ALBUTEROL 0.5-2.5 (3) MG/3ML IN SOLN: 3.0000 mL | Freq: Four times a day (QID) | RESPIRATORY_TRACT | Status: AC

## 2015-10-12 MED ORDER — APIXABAN 5 MG PO TABS: 10.0000 mg | ORAL_TABLET | Freq: Two times a day (BID) | ORAL | Status: DC

## 2015-10-12 MED ORDER — SENNA 8.6 MG PO TABS: 1.0000 | ORAL_TABLET | Freq: Every day | ORAL | Status: AC

## 2015-10-12 MED ORDER — PREDNISONE 10 MG (21) PO TBPK: 10.0000 mg | ORAL_TABLET | Freq: Every day | ORAL | Status: DC

## 2015-10-12 MED ORDER — AMOXICILLIN-POT CLAVULANATE 875-125 MG PO TABS: 1.0000 | ORAL_TABLET | Freq: Two times a day (BID) | ORAL | Status: DC

## 2015-10-12 MED ORDER — APIXABAN 5 MG PO TABS: 5.0000 mg | ORAL_TABLET | Freq: Two times a day (BID) | ORAL | Status: DC

## 2015-10-12 MED ORDER — APIXABAN 2.5 MG PO TABS: 5.0000 mg | ORAL_TABLET | Freq: Two times a day (BID) | ORAL | Status: DC

## 2015-10-12 NOTE — Discharge Summary (Signed)
Kirk Hubbard, is a 81 y.o. male  DOB 06/21/1923  MRN [id number].  Admission date:  10/08/2015  Admitting Physician  Vaughan Basta, MD  Discharge Date:  10/12/2015   Primary MD  Tracie Harrier, MD  Recommendations for primary care physician for things to follow:   Follow-up with Dr. Raul Del in 1 week, follow-up with Dr. Nestor Lewandowsky in 1 week.   Admission Diagnosis  Shortness of breath [R06.02] Swelling [R60.9] Redness [L53.9]   Discharge Diagnosis  Shortness of breath [R06.02] Swelling [R60.9] Redness [L53.9]   Principal Problem:   Cellulitis Active Problems:   DVT (deep venous thrombosis) (HCC)      Past Medical History  Diagnosis Date  . Hypertension   . Hyperlipidemia   . DJD (degenerative joint disease)   . History of gout   . History of kidney stones   . History of prostate cancer   . Renal disorder     kidney stones  . Cancer ([place])   . CHF (congestive heart failure) [facility])     Past Surgical History  Procedure Laterality Date  . Kidney stone surgery         History of present illness and  Hospital Course:     Kindly see H&P for history of present illness and admission details, please review complete Labs, Consult reports and Test reports for all details in brief  HPI  from the history and physical done on the day of admission #81 year old male with history of hypertension, hyperlipidemia with recent diagnosis of lung nodules getting workup comes in because of leg swelling. Patient has a DVT in the left leg and admitted for left-sided DVT, cellulitis of the left leg.  Hospital Course  #1 left leg DVT: Patient started on Eliquis., discussed the risk and benefits of anticoagulation with him. CT of the chest did not show any pulmonary emboli. Tolerated the eliquis. well.  #2 bilateral  leg cellulitis more in the left side. Received Unasyn, symptoms of cellulitis improved with less redness. Discharge home with Augmentin. #3 acute hypoxia secondary to COPD exacerbation, lung nodules: Patient is received oxygen, Solu-Medrol, nebulizers. Discharge home with An course of prednisone. And the patient qualified for home oxygen discharge home with 3 L off oxygen. On 3 L C he is saturating around 90%. #4 lung nodules patient followed by Dr. Raul Del and getting workup for possible malignancy and biopsy and waiting for insurance authorization. #5 deconditioning physical therapy recommended home health physical therapy ,[facility].  Discussed with the son Jerrye Beavers.  Discharge Condition: Stable   Follow UP      Discharge Instructions  and  Discharge Medications     Discharge Instructions    For home use only DME oxygen    Complete by:  As directed   Mode or (Route):  Nasal cannula  Liters per Minute:  3  Frequency:  Continuous (stationary and portable oxygen unit needed)  Oxygen conserving device:  Yes  Oxygen delivery system:  Gas            Medication List    TAKE these medications        albuterol 108 (90 BASE) MCG/ACT inhaler  Commonly known as:  PROVENTIL HFA;VENTOLIN HFA  Inhale 2 puffs into the lungs every 6 (six) hours as needed for wheezing or shortness of breath.     allopurinol 300 MG tablet  Commonly known as:  ZYLOPRIM  Take 300 mg by mouth daily.     amoxicillin-clavulanate 875-125  MG tablet  Commonly known as:  AUGMENTIN  Take 1 tablet by mouth 2 (two) times daily.     apixaban 5 MG Tabs tablet  Commonly known as:  ELIQUIS  Take 2 tablets (10 mg total) by mouth 2 (two) times daily.     apixaban 5 MG Tabs tablet  Commonly known as:  ELIQUIS  Take 1 tablet (5 mg total) by mouth 2 (two) times daily.  Start taking on:  10/19/2015     aspirin EC 81 MG tablet  Take 81 mg by mouth 2 (two) times daily.     budesonide-formoterol 160-4.5  MCG/ACT inhaler  Commonly known as:  SYMBICORT  Inhale 2 puffs into the lungs 2 (two) times daily.     furosemide 20 MG tablet  Commonly known as:  LASIX  Take 20 mg by mouth daily. Pt is able to take an additional tablet if needed for edema.     ipratropium-albuterol 0.5-2.5 (3) MG/3ML Soln  Commonly known as:  DUONEB  Take 3 mLs by nebulization 4 (four) times daily.     lisinopril 20 MG tablet  Commonly known as:  PRINIVIL,ZESTRIL  Take 20 mg by mouth daily.     lovastatin 20 MG tablet  Commonly known as:  MEVACOR  Take 20 mg by mouth at bedtime.     predniSONE 10 MG (21) Tbpk tablet  Commonly known as:  STERAPRED UNI-PAK 21 TAB  Take 1 tablet (10 mg total) by mouth daily. 4 tab daily for 3 days 3  Tab daily for 3 days 2 tab daily  for 3 days 1 tab daily for 3 days     senna 8.6 MG Tabs tablet  Commonly known as:  SENOKOT  Take 1 tablet (8.6 mg total) by mouth daily.          Diet and Activity recommendation: See Discharge Instructions above   Consults obtained - none   Major procedures and Radiology Reports - PLEASE review detailed and final reports for all details, in brief -     Dg Chest 2 View  10/08/2015  CLINICAL DATA:  Shortness of Breath EXAM: CHEST  2 VIEW COMPARISON:  07/13/2015 and report 09/21/2015 FINDINGS: Cardiomediastinal silhouette is stable. Persistent nodular density in left upper lobe measures 1.7 cm. Further correlation with PET scan is recommended to exclude malignancy. No acute infiltrate or pleural effusion. No pulmonary edema. Degenerative changes thoracic spine again noted. IMPRESSION: Persistent nodular density in left upper lobe measures 1.7 cm. Further correlation with PET scan is recommended to exclude malignancy. No acute infiltrate or pleural effusion. No pulmonary edema. Electronically Signed   By: Lahoma Crocker M.D.   On: 10/08/2015 17:59   Ct Angio Chest Pe W/cm &/or Wo Cm  10/08/2015  CLINICAL DATA:  Shortness of breath. Left lower  leg pain and swelling. EXAM: CT ANGIOGRAPHY CHEST WITH CONTRAST TECHNIQUE: Multidetector CT imaging of the chest was performed using the standard protocol during bolus administration of intravenous contrast. Multiplanar CT image reconstructions and MIPs were obtained to evaluate the vascular anatomy. CONTRAST:  76mL OMNIPAQUE IOHEXOL 350 MG/ML SOLN COMPARISON:  10/08/2015; 07/13/2015 FINDINGS: Mediastinum/Nodes: Extensive motion artifact during scanning of lung bases obscures a portion of the pulmonary arterial tree. With this limitation in mind, No filling defect is identified in the pulmonary arterial tree to suggest pulmonary embolus. Coronary, aortic arch, and branch vessel atherosclerotic vascular disease. No aortic dissection or acute aortic findings. There is some calcification of the aortic valve leaflets. Mild  cardiomegaly is present. A somewhat heterogeneous nodular right paratracheal density measures 1.9 by 2.6 cm on image 21 of series 4 and previously measured the same on 07/13/2015. This has a small stalk like connection to the thyroid gland and could well represent a thyroid nodule, with pathologic adenopathy as a less likely differential diagnostic consideration given the lack of any adenopathy elsewhere in the chest. Lungs/Pleura: Airway thickening is present, suggesting bronchitis or reactive airways disease. 3.2 by 2.1 cm sub solid lesion along the right hilum, stable. Stable scarring in the right middle lobe. Considerable airway plugging and airway thickening causing luminal occlusion in the lower lobe tracheobronchial tree. 3 mm apical posterior segment left upper lobe nodule, image 22 series 6. Faint 1.3 by 0.8 cm sub solid nodule anteriorly in the left upper lobe on image 34 series 6, not well seen on the prior exam. Laterally in the left upper lobe there is a partially sub solid 2.9 by 2.0 cm nodule, previously 2.6 by 1.8 cm, currently on image 55 series 8. Upper abdomen: Partial visualization  of a cystic lesion of the right kidney upper pole. Musculoskeletal: Thoracic spondylosis. Slight irregularity posteriorly in the T1 vertebral body with some underlying sclerosis and lucency, involved region approximately 1.6 cm, nonspecific. Review of the MIP images confirms the above findings. IMPRESSION: 1. The left upper lobe nodule persists and is slightly larger than on the August exam. Although partially sub solid, this lesion has a solid portion that is greater than 5 mm in size. Based on current guidelines, biopsy or surgical resection is recommended due to significant risk of adenocarcinoma. This recommendation follows the consensus statement: Recommendations for the Management of Subsolid Pulmonary Nodules Detected at CT: A Statement from the [place] as published in Radiology 2013; 266:304-317. 2. In addition, there is a 3.2 by 2.1 cm sub solid mass in the right perihilar region which is persistent and concerning for malignancy, although more purely sub solid without a significant solid component. 3. 1.3 by 0.8 cm faintly sub solid nodule anteriorly in the left upper lobe also merits observation, but was not well seen on the prior exam. 4. No embolus is identified. Please note that portions of the lung bases are completely obscured by motion artifact, Cecille Rubin negative predictive value of today' s exam. 5. Coronary, aortic arch, and branch vessel atherosclerotic vascular disease. Mild cardiomegaly. 6. Airway thickening, especially severe in the lower lobes, with some airway plugging in the lower lobes. 7. Stable 1.9 by 2.6 cm nodular right paratracheal density in the upper paratracheal range, somewhat heterogeneous. This seems to have a small stalk like connection to the thyroid gland and is probably a thyroid nodule, less likely to be pathologic paratracheal adenopathy. 8. Nonspecific mixed sclerosis and lucency posteriorly in the T1 vertebral body, could be a hemangioma but I cannot exclude  malignancy. Electronically Signed   By: Van Clines M.D.   On: 10/08/2015 20:27   US Venous Img Lower Unilateral Left  10/08/2015  CLINICAL DATA:  Swelling and redness in the calf.  Previous DVTs. EXAM: Left LOWER EXTREMITY VENOUS DOPPLER ULTRASOUND TECHNIQUE: Gray-scale sonography with graded compression, as well as color Doppler and duplex ultrasound were performed to evaluate the lower extremity deep venous systems from the level of the common femoral vein and including the common femoral, femoral, profunda femoral, popliteal and calf veins including the posterior tibial, peroneal and gastrocnemius veins when visible. The superficial great saphenous vein was also interrogated. Spectral Doppler was utilized to evaluate  flow at rest and with distal augmentation maneuvers in the common femoral, femoral and popliteal veins. COMPARISON:  None. FINDINGS: Contralateral Common Femoral Vein: Respiratory phasicity is normal and symmetric with the symptomatic side. No evidence of thrombus. Normal compressibility. Common Femoral Vein: No evidence of thrombus. Normal compressibility, respiratory phasicity and response to augmentation. Saphenofemoral Junction: No evidence of thrombus. Normal compressibility and flow on color Doppler imaging. Profunda Femoral Vein: No evidence of thrombus. Normal compressibility and flow on color Doppler imaging. Femoral Vein: Partial filling defect demonstrated in the distal superficial femoral vein consistent with nonocclusive thrombus. Popliteal Vein: Filling defect demonstrated in the popliteal vein consistent with nonocclusive thrombus. Calf Veins: No evidence of thrombus. Normal compressibility and flow on color Doppler imaging. Superficial Great Saphenous Vein: No evidence of thrombus. Normal compressibility and flow on color Doppler imaging. Venous Reflux:  None. Other Findings:  None. IMPRESSION: Positive for deep venous thrombosis. Nonocclusive thrombus demonstrated in the  distal left superficial femoral vein and in the popliteal vein. These results were called by telephone at the time of interpretation on 10/08/2015 at 6:33 pm to Dr. Marcelene Butte, who verbally acknowledged these results. Electronically Signed   By: Lucienne Capers M.D.   On: 10/08/2015 18:35    Micro Results    Recent Results (from the past 240 hour(s))  Culture, blood (routine x 2)     Status: None (Preliminary result)   Collection Time: 10/08/15  7:24 PM  Result Value Ref Range Status   Specimen Description BLOOD LEFT ANTECUBITAL  Final   Special Requests BOTTLES DRAWN AEROBIC AND ANAEROBIC 5CC  Final   Culture NO GROWTH 3 DAYS  Final   Report Status PENDING  Incomplete  Culture, blood (routine x 2)     Status: None (Preliminary result)   Collection Time: 10/08/15  7:24 PM  Result Value Ref Range Status   Specimen Description BLOOD RIGHT ANTECUBITAL  Final   Special Requests BOTTLES DRAWN AEROBIC AND ANAEROBIC 6CC  Final   Culture NO GROWTH 3 DAYS  Final   Report Status PENDING  Incomplete       Today   Subjective:   Kirk Hubbard today has no headache,no chest abdominal pain,no new weakness tingling or numbness, feels much better wants to go home today.   Objective:   Blood pressure 124/62, pulse 86, temperature 97.6 F (36.4 C), temperature source Oral, resp. rate 17, height 6' (1.829 m), weight 113.127 kg (249 lb 6.4 oz), SpO2 94 %.   Intake/Output Summary (Last 24 hours) at 10/12/15 1307 Last data filed at 10/12/15 0800  Gross per 24 hour  Intake    480 ml  Output    100 ml  Net    380 ml    Exam Awake Alert, Oriented x 3, No new F.N deficits, Normal affect [place].AT,PERRAL Supple Neck,No JVD, No cervical lymphadenopathy appriciated.  Symmetrical Chest wall movement, Good air movement bilaterally, CTAB RRR,No Gallops,Rubs or new Murmurs, No Parasternal Heave +ve B.Sounds, Abd Soft, Non tender, No organomegaly appriciated, No rebound -guarding or rigidity. No Cyanosis,  Clubbing or edema, No new Rash or bruise  Data Review   CBC w Diff: Lab Results  Component Value Date   WBC 12.3* 10/12/2015   HGB 13.0 10/12/2015   HCT 39.4* 10/12/2015   PLT 185 10/12/2015   LYMPHOPCT 4 10/12/2015   MONOPCT 2 10/12/2015   EOSPCT 0 10/12/2015   BASOPCT 0 10/12/2015    CMP: Lab Results  Component Value Date   NA  143 10/09/2015   K 4.2 10/09/2015   CL 104 10/09/2015   CO2 32 10/09/2015   BUN 15 10/09/2015   CREATININE 1.10 10/12/2015   PROT 7.6 10/08/2015   ALBUMIN 4.1 10/08/2015   BILITOT 0.7 10/08/2015   ALKPHOS 92 10/08/2015   AST 18 10/08/2015   ALT 14* 10/08/2015  .   Total Time in preparing paper work, data evaluation and todays exam - 65 minutes  Mancel Lardizabal M.D on 10/12/2015 at 1:07 PM    Note: This dictation was prepared with Dragon dictation along with smaller phrase technology. Any transcriptional errors that result from this process are unintentional.

## 2015-10-12 NOTE — Discharge Planning (Addendum)
Pt was 86% SPO2 on RA at rest. Pt Needs to be on 4L to remain above 90% SPO2 at rest. Dr. Informed - He will need to be sent out on continuous oxygen if discharged today.

## 2015-10-12 NOTE — Progress Notes (Signed)
New referral for Kirk Hubbard, PT and CNA services received from [facility]. Patient was admitted to [facility] on 11/25 for treatment of cellulitius in the left lower leg, doppler study of left lower extremity revealed a DVT. He has been treated with IV antibiotics and has also required oxygen therapy. He will be discharged with orders for oxygen via nasal cannula at 3 Liters. Patient has a history of prostate Ca, DJD, Hyperlipidemia and HTN. He also has newly diagnosed lung nodules. Patient is a DNR code, signed portable DNR in place on patient's chart for discharge. Writer met with patient and his son Jerrye Beavers, services outlined. Patient does live alone, he has 3 sons who have agreed to provide 24 hour supervision. Per Pt note and patient report he does ambulate with a  Cane, no other equipment in the home. Oxygen to be provided by [facility]. Plan is for discharge today via [facility]. F2F and Home Health orders, along with patient information faxed to referral. Thank you the opportunity to be involved in the care of this patient.  Flo Shanks, RN, BSN, [facility] Liaison [phone] c

## 2015-10-12 NOTE — Plan of Care (Addendum)
No c/o pain. VSS, afebrile. IV Abx. Scheduled duonebs & solumedrol w/ noted improvement. Will continue to assess.  Problem: Safety: Goal: Ability to remain free from injury will improve High fall risk. Bed alarm on, hourly rounding w/ toileting offered. Pt understands how to use call system for assistance. Safe environment provided.  Problem: Skin Integrity: Goal: Risk for impaired skin integrity will decrease Outcome: Progressing Left buttocks skin tear w/ foam for protection. Bilateral lower extremity edema- legs elevated. Left leg w/ DVT & cellulitis improving. Incontinent x2 tonight. Skin care provided.  Problem: Tissue Perfusion: Goal: Risk factors for ineffective tissue perfusion will decrease Outcome: Progressing Pt refuses SCD's. Eliquis given for DVT.  Problem: Activity: Goal: Risk for activity intolerance will decrease Outcome: Progressing +1 assist w/ cane w/ rest periods provided. Remains on 3L O2 w/ sats mid 90s

## 2015-10-12 NOTE — Progress Notes (Addendum)
SATURATION QUALIFICATIONS: (This note is used to comply with regulatory documentation for home oxygen)  Patient Saturations on Room Air at Rest =86%   

## 2015-10-12 NOTE — Care Management (Signed)
Admitted to [facility] with the diagnosis of cellulitis. Lives alone. Last seen Dr. Ginette Pitman 2 weeks ago. States his family stays with him at night 2-3 days a week. "They are always in and out." Friends come in and helps with house work, cooking, getting stockings on and off 4  Times a week. Oldest son [name]) is Kirk Hubbard [phone]). Youngest son is Kirk Hubbard [phone]), just got laid off at [employer] in [place]. No home health. No skilled facility. No home oxygen. Uses a cane to aid in ambulation, has a rolling walker if needed. Fell last Friday. Good appetite most of the time. Family will transport Physical therapy evaluation completed. Recommends home health physical therapy. Would like [facility].  Shelbie Ammons RN MSN CCM Care Management [phone]

## 2015-10-12 NOTE — Discharge Planning (Signed)
Pt IV removed.  Pt DC papers given, explained and educated.  O2 delivered to hospital and set up with [facility] for [facility]. Told of suggested FU appts needed. Given script and told of more sent to [facility].  Pt will be wheeled to front and family driving home via car.

## 2015-10-12 NOTE — Care Management (Signed)
Discharge to home today per Dr. Dian Queen. Mr. Kirk Hubbard requested [facility] this morning. Update called to Flo Shanks RN representative for [facility]. Will need home oxygen. Will Anderson, representative for durable medical equipment updated.  Family will transport. Shelbie Ammons RN MSN CCM Care Management [phone]

## 2015-10-13 LAB — CULTURE, BLOOD (ROUTINE X 2)
Culture: NO GROWTH
Culture: NO GROWTH

## 2015-11-29 ENCOUNTER — Emergency Department: Payer: Medicare Other

## 2015-11-29 ENCOUNTER — Inpatient Hospital Stay
Admission: EM | Admit: 2015-11-29 | Discharge: 2015-12-02 | DRG: 190 | Disposition: A | Discharge status: Skilled Nursing Facility | Payer: Medicare Other | Attending: Internal Medicine | Admitting: Internal Medicine

## 2015-11-29 ENCOUNTER — Encounter: Payer: Self-pay | Admitting: Emergency Medicine

## 2015-11-29 DIAGNOSIS — F0390 Unspecified dementia without behavioral disturbance: Secondary | ICD-10-CM | POA: Diagnosis present

## 2015-11-29 DIAGNOSIS — M109 Gout, unspecified: Secondary | ICD-10-CM | POA: Diagnosis present

## 2015-11-29 DIAGNOSIS — I5042 Chronic combined systolic (congestive) and diastolic (congestive) heart failure: Secondary | ICD-10-CM | POA: Diagnosis present

## 2015-11-29 DIAGNOSIS — Z87442 Personal history of urinary calculi: Secondary | ICD-10-CM

## 2015-11-29 DIAGNOSIS — G9341 Metabolic encephalopathy: Secondary | ICD-10-CM | POA: Diagnosis present

## 2015-11-29 DIAGNOSIS — E785 Hyperlipidemia, unspecified: Secondary | ICD-10-CM | POA: Diagnosis present

## 2015-11-29 DIAGNOSIS — Z86718 Personal history of other venous thrombosis and embolism: Secondary | ICD-10-CM

## 2015-11-29 DIAGNOSIS — Z9981 Dependence on supplemental oxygen: Secondary | ICD-10-CM

## 2015-11-29 DIAGNOSIS — Z7901 Long term (current) use of anticoagulants: Secondary | ICD-10-CM

## 2015-11-29 DIAGNOSIS — L89152 Pressure ulcer of sacral region, stage 2: Secondary | ICD-10-CM | POA: Diagnosis present

## 2015-11-29 DIAGNOSIS — M1990 Unspecified osteoarthritis, unspecified site: Secondary | ICD-10-CM | POA: Diagnosis present

## 2015-11-29 DIAGNOSIS — L8990 Pressure ulcer of unspecified site, unspecified stage: Secondary | ICD-10-CM | POA: Insufficient documentation

## 2015-11-29 DIAGNOSIS — J441 Chronic obstructive pulmonary disease with (acute) exacerbation: Secondary | ICD-10-CM | POA: Diagnosis present

## 2015-11-29 DIAGNOSIS — R410 Disorientation, unspecified: Secondary | ICD-10-CM

## 2015-11-29 DIAGNOSIS — I110 Hypertensive heart disease with heart failure: Secondary | ICD-10-CM | POA: Diagnosis present

## 2015-11-29 DIAGNOSIS — J440 Chronic obstructive pulmonary disease with acute lower respiratory infection: Principal | ICD-10-CM | POA: Diagnosis present

## 2015-11-29 DIAGNOSIS — J9610 Chronic respiratory failure, unspecified whether with hypoxia or hypercapnia: Secondary | ICD-10-CM | POA: Diagnosis present

## 2015-11-29 DIAGNOSIS — Z8546 Personal history of malignant neoplasm of prostate: Secondary | ICD-10-CM | POA: Diagnosis not present

## 2015-11-29 DIAGNOSIS — J189 Pneumonia, unspecified organism: Secondary | ICD-10-CM

## 2015-11-29 DIAGNOSIS — R0602 Shortness of breath: Secondary | ICD-10-CM

## 2015-11-29 DIAGNOSIS — R911 Solitary pulmonary nodule: Secondary | ICD-10-CM

## 2015-11-29 DIAGNOSIS — Z8249 Family history of ischemic heart disease and other diseases of the circulatory system: Secondary | ICD-10-CM

## 2015-11-29 HISTORY — DX: Acute embolism and thrombosis of unspecified deep veins of unspecified lower extremity: I82.409

## 2015-11-29 HISTORY — DX: Chronic obstructive pulmonary disease, unspecified: J44.9

## 2015-11-29 LAB — COMPREHENSIVE METABOLIC PANEL
ALT: 16 U/L — AB (ref 17–63)
ANION GAP: 4 — AB (ref 5–15)
AST: 17 U/L (ref 15–41)
Albumin: 3.9 g/dL (ref 3.5–5.0)
Alkaline Phosphatase: 82 U/L (ref 38–126)
BUN: 32 mg/dL — ABNORMAL HIGH (ref 6–20)
CHLORIDE: 102 mmol/L (ref 101–111)
CO2: 39 mmol/L — ABNORMAL HIGH (ref 22–32)
CREATININE: 1.2 mg/dL (ref 0.61–1.24)
Calcium: 9.2 mg/dL (ref 8.9–10.3)
GFR, EST AFRICAN AMERICAN: 60 mL/min — AB (ref >60)
GFR, EST NON AFRICAN AMERICAN: 51 mL/min — AB (ref >60)
Glucose, Bld: 118 mg/dL — ABNORMAL HIGH (ref 65–99)
Potassium: 4.4 mmol/L (ref 3.5–5.1)
SODIUM: 145 mmol/L (ref 135–145)
Total Bilirubin: 0.5 mg/dL (ref 0.3–1.2)
Total Protein: 6.9 g/dL (ref 6.5–8.1)

## 2015-11-29 LAB — CBC
HCT: 41.5 % (ref 40.0–52.0)
HEMOGLOBIN: 13.4 g/dL (ref 13.0–18.0)
MCH: 30.4 pg (ref 26.0–34.0)
MCHC: 32.2 g/dL (ref 32.0–36.0)
MCV: 94.4 fL (ref 80.0–100.0)
PLATELETS: 231 10*3/uL (ref 150–440)
RBC: 4.4 MIL/uL (ref 4.40–5.90)
RDW: 14.5 % (ref 11.5–14.5)
WBC: 13.7 10*3/uL — ABNORMAL HIGH (ref 3.8–10.6)

## 2015-11-29 LAB — TROPONIN I: Troponin I: <0.03 ng/mL (ref <0.031)

## 2015-11-29 LAB — PROTIME-INR
INR: 1.05
PROTHROMBIN TIME: 13.9 s (ref 11.4–15.0)

## 2015-11-29 LAB — BRAIN NATRIURETIC PEPTIDE: B NATRIURETIC PEPTIDE 5: 80 pg/mL (ref 0.0–100.0)

## 2015-11-29 LAB — LACTIC ACID, PLASMA
Lactic Acid, Venous: 1 mmol/L (ref 0.5–2.0)
Lactic Acid, Venous: 0.9 mmol/L (ref 0.5–2.0)

## 2015-11-29 MED ORDER — DEXTROSE 5 % IV SOLN
1.0000 g | INTRAVENOUS | Status: DC
  Administered 2015-11-29 – 2015-12-01 (×3): 1 g via INTRAVENOUS
  Filled 2015-11-29 (×3): qty 10

## 2015-11-29 MED ORDER — DEXTROSE 5 % IV SOLN
250.0000 mg | INTRAVENOUS | Status: DC
  Administered 2015-11-30 – 2015-12-01 (×2): 250 mg via INTRAVENOUS
  Filled 2015-11-29 (×2): qty 250

## 2015-11-29 MED ORDER — PRAVASTATIN SODIUM 20 MG PO TABS
20.0000 mg | ORAL_TABLET | Freq: Every day | ORAL | Status: DC
  Administered 2015-11-30 – 2015-12-01 (×2): 20 mg via ORAL
  Filled 2015-11-29 (×2): qty 1

## 2015-11-29 MED ORDER — FUROSEMIDE 40 MG PO TABS
40.0000 mg | ORAL_TABLET | Freq: Every day | ORAL | Status: DC
Start: 2015-11-30 — End: 2015-12-02
  Administered 2015-11-30 – 2015-12-02 (×3): 40 mg via ORAL
  Filled 2015-11-29 (×3): qty 1

## 2015-11-29 MED ORDER — SENNA 8.6 MG PO TABS: 1.0000 | ORAL_TABLET | Freq: Every day | ORAL | Status: DC | PRN

## 2015-11-29 MED ORDER — ALLOPURINOL 100 MG PO TABS
300.0000 mg | ORAL_TABLET | Freq: Every day | ORAL | Status: DC
  Administered 2015-11-30 – 2015-12-02 (×3): 300 mg via ORAL
  Filled 2015-11-29 (×3): qty 3

## 2015-11-29 MED ORDER — ASPIRIN EC 81 MG PO TBEC
81.0000 mg | DELAYED_RELEASE_TABLET | Freq: Two times a day (BID) | ORAL | Status: DC
  Administered 2015-11-30 – 2015-12-02 (×5): 81 mg via ORAL
  Filled 2015-11-29 (×5): qty 1

## 2015-11-29 MED ORDER — METHYLPREDNISOLONE SODIUM SUCC 125 MG IJ SOLR
60.0000 mg | Freq: Four times a day (QID) | INTRAMUSCULAR | Status: DC
  Administered 2015-11-29 – 2015-11-30 (×3): 60 mg via INTRAVENOUS
  Filled 2015-11-29 (×3): qty 2

## 2015-11-29 MED ORDER — METOPROLOL TARTRATE 50 MG PO TABS
50.0000 mg | ORAL_TABLET | Freq: Two times a day (BID) | ORAL | Status: DC
Start: 2015-11-30 — End: 2015-12-02
  Administered 2015-11-30 – 2015-12-02 (×5): 50 mg via ORAL
  Filled 2015-11-29 (×5): qty 1

## 2015-11-29 MED ORDER — LEVOFLOXACIN IN D5W 750 MG/150ML IV SOLN
750.0000 mg | Freq: Once | INTRAVENOUS | Status: AC
  Administered 2015-11-29: 750 mg via INTRAVENOUS
  Filled 2015-11-29: qty 150

## 2015-11-29 MED ORDER — IPRATROPIUM-ALBUTEROL 0.5-2.5 (3) MG/3ML IN SOLN
3.0000 mL | Freq: Four times a day (QID) | RESPIRATORY_TRACT | Status: DC
  Administered 2015-11-30: 3 mL via RESPIRATORY_TRACT
  Filled 2015-11-29: qty 3

## 2015-11-29 MED ORDER — ENOXAPARIN SODIUM 40 MG/0.4ML ~~LOC~~ SOLN
40.0000 mg | Freq: Every day | SUBCUTANEOUS | Status: DC
  Administered 2015-11-30 (×2): 40 mg via SUBCUTANEOUS
  Filled 2015-11-29 (×2): qty 0.4

## 2015-11-29 MED ORDER — TIOTROPIUM BROMIDE MONOHYDRATE 18 MCG IN CAPS
18.0000 ug | ORAL_CAPSULE | Freq: Every day | RESPIRATORY_TRACT | Status: DC
  Administered 2015-11-30 – 2015-12-02 (×3): 18 ug via RESPIRATORY_TRACT
  Filled 2015-11-29: qty 5

## 2015-11-29 MED ORDER — APIXABAN 5 MG PO TABS: 5.0000 mg | ORAL_TABLET | Freq: Two times a day (BID) | ORAL | Status: DC

## 2015-11-29 MED ORDER — LISINOPRIL 20 MG PO TABS
20.0000 mg | ORAL_TABLET | Freq: Every day | ORAL | Status: DC
  Administered 2015-11-30 – 2015-12-02 (×3): 20 mg via ORAL
  Filled 2015-11-29 (×3): qty 1

## 2015-11-29 NOTE — H&P (Signed)
[facility] [place] NAME: Kirk Hubbard    MR#:  [id number]  DATE OF BIRTH:  06/21/1923  DATE OF ADMISSION:  11/29/2015  PRIMARY CARE PHYSICIAN: Tracie Harrier, MD   REQUESTING/REFERRING PHYSICIAN: Dr.Goodman  CHIEF COMPLAINT:   Chief Complaint  Patient presents with  . Altered Mental Status    HISTORY OF PRESENT ILLNESS: Kirk Hubbard  is a 82 y.o. male with a known history of hypertension, hyperlipidemia, degenerative joint disease, prostate cancer, congestive heart failure, COPD and chronic 3 L oxygen use at home, DVT and Eliquis, lives alone with one of his son visiting him every day- for last 2-3 days he is little more confused and gradually getting weak also has some cough and sputum production but not able to cough it out Brought to emergency room and found having bilateral lower lobes infiltrates on chest x-ray, elevated white cell count, increased requirement of oxygen supplementation. He is given his admission to hospitalist team for COPD and pneumonia.  PAST MEDICAL HISTORY:   Past Medical History  Diagnosis Date  . Hypertension   . Hyperlipidemia   . DJD (degenerative joint disease)   . History of gout   . History of kidney stones   . History of prostate cancer   . Renal disorder     kidney stones  . Cancer ([place])   . CHF (congestive heart failure) ([place])   . COPD (chronic obstructive pulmonary disease) ([place])   . DVT (deep venous thrombosis) ([place]) sept 2016    PAST SURGICAL HISTORY: Past Surgical History  Procedure Laterality Date  . Kidney stone surgery      SOCIAL HISTORY:  Social History  Substance Use Topics  . Smoking status: Former Smoker    Types: Cigarettes    Quit date: 11/13/1988  . Smokeless tobacco: Not on file  . Alcohol Use: No    FAMILY HISTORY:  Family History  Problem Relation Age of Onset  . Congestive Heart Failure Brother     DRUG ALLERGIES: No Known Allergies  REVIEW OF SYSTEMS:    Patient has some baseline confusion so history obtained from his son who is present in the room.  MEDICATIONS AT HOME:  Prior to Admission medications   Medication Sig Start Date End Date Taking? Authorizing Provider  albuterol (PROVENTIL HFA;VENTOLIN HFA) 108 (90 BASE) MCG/ACT inhaler Inhale 2 puffs into the lungs every 6 (six) hours as needed for wheezing or shortness of breath.   Yes Historical Provider, MD  allopurinol (ZYLOPRIM) 300 MG tablet Take 300 mg by mouth daily.   Yes Historical Provider, MD  aspirin EC 81 MG tablet Take 81 mg by mouth 2 (two) times daily.    Yes Historical Provider, MD  budesonide-formoterol (SYMBICORT) 160-4.5 MCG/ACT inhaler Inhale 2 puffs into the lungs 2 (two) times daily.   Yes Historical Provider, MD  furosemide (LASIX) 20 MG tablet Take 40 mg by mouth daily.    Yes Historical Provider, MD  ipratropium-albuterol (DUONEB) 0.5-2.5 (3) MG/3ML SOLN Take 3 mLs by nebulization 4 (four) times daily. Patient taking differently: Take 3 mLs by nebulization 4 (four) times daily as needed (for wheezing/shortness of breath).  10/12/15  Yes Epifanio Lesches, MD  lisinopril (PRINIVIL,ZESTRIL) 20 MG tablet Take 20 mg by mouth daily.   Yes Historical Provider, MD  lovastatin (MEVACOR) 20 MG tablet Take 20 mg by mouth at bedtime.    Yes Historical Provider, MD  metoprolol (LOPRESSOR) 50 MG tablet Take 50 mg  by mouth 2 (two) times daily.   Yes Historical Provider, MD  amoxicillin-clavulanate (AUGMENTIN) 875-125 MG tablet Take 1 tablet by mouth 2 (two) times daily. Patient not taking: Reported on 11/29/2015 10/12/15   Epifanio Lesches, MD  apixaban (ELIQUIS) 5 MG TABS tablet Take 2 tablets (10 mg total) by mouth 2 (two) times daily. Patient not taking: Reported on 11/29/2015 10/12/15   Epifanio Lesches, MD  apixaban (ELIQUIS) 5 MG TABS tablet Take 1 tablet (5 mg total) by mouth 2 (two) times daily. Patient not taking: Reported on 11/29/2015 10/19/15   Epifanio Lesches, MD  predniSONE (STERAPRED UNI-PAK 21 TAB) 10 MG (21) TBPK tablet Take 1 tablet (10 mg total) by mouth daily. 4 tab daily for 3 days 3  Tab daily for 3 days 2 tab daily  for 3 days 1 tab daily for 3 days Patient not taking: Reported on 11/29/2015 10/12/15   Epifanio Lesches, MD  senna (SENOKOT) 8.6 MG TABS tablet Take 1 tablet (8.6 mg total) by mouth daily. Patient not taking: Reported on 11/29/2015 10/12/15   Epifanio Lesches, MD      PHYSICAL EXAMINATION:   VITAL SIGNS: Blood pressure 121/58, pulse 52, temperature 97.5 F (36.4 C), temperature source Oral, resp. rate 19, weight 110.904 kg (244 lb 8 oz), SpO2 97 %.  GENERAL:  82 y.o.-year-old patient lying in the bed with no acute distress.  EYES: Pupils equal, round, reactive to light and accommodation. No scleral icterus. Extraocular muscles intact.  HEENT: Head atraumatic, normocephalic. Oropharynx and nasopharynx clear.  NECK:  Supple, no jugular venous distention. No thyroid enlargement, no tenderness.  LUNGS: Normal breath sounds bilaterally, expiratory wheezing, mild crepitation. No use of accessory muscles of respiration. On 4 L oxygen supplementation. CARDIOVASCULAR: S1, S2 normal. No murmurs, rubs, or gallops.  ABDOMEN: Soft, nontender, nondistended. Bowel sounds present. No organomegaly or mass.  EXTREMITIES: No pedal edema, cyanosis, or clubbing.  NEUROLOGIC: Cranial nerves II through XII are intact. Muscle strength 5/5 in all extremities. Sensation intact. Gait not checked.  PSYCHIATRIC: The patient is alert and oriented x 3.  SKIN: Skin that down on his buttocks present.  LABORATORY PANEL:   CBC  Recent Labs Lab 11/29/15 1753  WBC 13.7*  HGB 13.4  HCT 41.5  PLT 231  MCV 94.4  MCH 30.4  MCHC 32.2  RDW 14.5   ------------------------------------------------------------------------------------------------------------------  Chemistries   Recent Labs Lab 11/29/15 1753  NA 145  K 4.4  CL 102   CO2 39*  GLUCOSE 118*  BUN 32*  CREATININE 1.20  CALCIUM 9.2  AST 17  ALT 16*  ALKPHOS 82  BILITOT 0.5   ------------------------------------------------------------------------------------------------------------------ estimated creatinine clearance is 52.6 mL/min (by C-G formula based on Cr of 1.2). ------------------------------------------------------------------------------------------------------------------ No results for input(s): TSH, T4TOTAL, T3FREE, THYROIDAB in the last 72 hours.  Invalid input(s): FREET3   Coagulation profile  Recent Labs Lab 11/29/15 1753  INR 1.05   ------------------------------------------------------------------------------------------------------------------- No results for input(s): DDIMER in the last 72 hours. -------------------------------------------------------------------------------------------------------------------  Cardiac Enzymes  Recent Labs Lab 11/29/15 1850  TROPONINI <0.03   ------------------------------------------------------------------------------------------------------------------ Invalid input(s): POCBNP  ---------------------------------------------------------------------------------------------------------------  Urinalysis No results found for: COLORURINE, APPEARANCEUR, LABSPEC, PHURINE, GLUCOSEU, HGBUR, BILIRUBINUR, KETONESUR, PROTEINUR, UROBILINOGEN, NITRITE, LEUKOCYTESUR   RADIOLOGY: Ct Head Wo Contrast  11/29/2015  CLINICAL DATA:  Altered mental status since 1 a.m. today EXAM: CT HEAD WITHOUT CONTRAST TECHNIQUE: Contiguous axial images were obtained from the base of the skull through the vertex without intravenous contrast. COMPARISON:  10/16/2009 FINDINGS: Calvarium  is intact. Study limited by mild motion artifact. Moderate diffuse atrophy is noted. There is mild diffuse low attenuation in the deep white matter. No evidence of mass or vascular territory infarct. No hemorrhage or extra-axial fluid.  IMPRESSION: Age-related involutional change with no acute findings Electronically Signed   By: Skipper Cliche M.D.   On: 11/29/2015 19:32   Dg Chest Portable 1 View  11/29/2015  CLINICAL DATA:  82 year old male with altered mental status today. Bilateral leg edema. Cough and congestion. EXAM: PORTABLE CHEST 1 VIEW COMPARISON:  Chest x-ray 10/08/2015. FINDINGS: Lung volumes are slightly low. Bibasilar opacities (left greater than right) may reflect areas of atelectasis and/or consolidation. Possible small left pleural effusion. No right pleural effusion. Mild crowding of the pulmonary vasculature, accentuated by low lung volumes, without frank pulmonary edema. Mild cardiomegaly. Upper mediastinal contours are within normal limits. Atherosclerosis in the thoracic aorta. IMPRESSION: 1. Low lung volumes with bibasilar (left greater than right) areas of atelectasis and/or consolidation, with small left pleural effusion. 2. Atherosclerosis. Electronically Signed   By: Vinnie Langton M.D.   On: 11/29/2015 18:24   IMPRESSION AND PLAN:  * Pneumonia and COPD exacerbation causing acute on chronic respiratory failure  Give IV Rocephin and did some icing, IV steroid and nebulizer therapy.  Give Spiriva.  Continue oxygen supplementation for now and try to taper, patient is a 3 L home oxygen at baseline.  Try to get sputum culture for further guiding the therapy.  * Hypertension  Continue home medications currently stable.  * DVT  He is on Eliquis, continue the same.  * Hyperlipidemia  Continue stating    All the records are reviewed and case discussed with ED provider. Management plans discussed with the patient, family and they are in agreement.  CODE STATUS: DNR Code Status History    Date Active Date Inactive Code Status Order ID Comments User Context   10/08/2015 10:35 PM 10/12/2015  6:45 PM DNR [id number]  Vaughan Basta, MD Inpatient    Questions for Most Recent Historical Code  Status (Order [id number])    Question Answer Comment   In the event of cardiac or respiratory ARREST Do not call a "code blue"    In the event of cardiac or respiratory ARREST Do not perform Intubation, CPR, defibrillation or ACLS    In the event of cardiac or respiratory ARREST Use medication by any route, position, wound care, and other measures to relive pain and suffering. May use oxygen, suction and manual treatment of airway obstruction as needed for comfort.        TOTAL TIME TAKING CARE OF THIS PATIENT: 50 minutes.    Vaughan Basta M.D on 11/29/2015   Between 7am to 6pm - Pager - [phone]  After 6pm go to [email or web address] - password [email or web address] [facility]  Office  [phone]  CC: Primary care physician; Tracie Harrier, MD   Note: This dictation was prepared with Dragon dictation along with smaller phrase technology. Any transcriptional errors that result from this process are unintentional.

## 2015-11-29 NOTE — ED Notes (Signed)
Pt here from home via [facility] with family c/o AMS since 0100 today. Pt denies any complaints at present, pt able to answer questions appropriately for age. Pt with bilateral edema to both legs and congested cough. Pt states "I don't think I really need to be here".

## 2015-11-29 NOTE — ED Provider Notes (Signed)
[facility] Emergency Department Provider Note    ____________________________________________  Time seen: 1820  I have reviewed the triage vital signs and the nursing notes.   HISTORY  Chief Complaint Altered Mental Status   History limited by: Not Limited   HPI Kirk Hubbard is a 82 y.o. male with history of HTN, CHF who presents to the emergency department today brought in by EMS because of concern for confusion. Family states that he has been "talking out of his head". They noticed a little of this yesterday but it was worse this morning. They state that it was not constant, and the symptoms would come and go. They also noticed that the patient has had a couple of falls recently. Furthermore he has had worsening shortness of breath for the past couple of days. He is on home oxygen at 3 liters. He has had a non productive cough. They have not noticed any fevers.   Past Medical History  Diagnosis Date  . Hypertension   . Hyperlipidemia   . DJD (degenerative joint disease)   . History of gout   . History of kidney stones   . History of prostate cancer   . Renal disorder     kidney stones  . Cancer ([place])   . CHF (congestive heart failure) [facility])     Patient Active Problem List   Diagnosis Date Noted  . Cellulitis 10/08/2015  . DVT (deep venous thrombosis) ([place]) 10/08/2015  . Lung nodule seen on imaging study 09/02/2015    Past Surgical History  Procedure Laterality Date  . Kidney stone surgery      Current Outpatient Rx  Name  Route  Sig  Dispense  Refill  . albuterol (PROVENTIL HFA;VENTOLIN HFA) 108 (90 BASE) MCG/ACT inhaler   Inhalation   Inhale 2 puffs into the lungs every 6 (six) hours as needed for wheezing or shortness of breath.         . allopurinol (ZYLOPRIM) 300 MG tablet   Oral   Take 300 mg by mouth daily.         Marland Kitchen amoxicillin-clavulanate (AUGMENTIN) 875-125 MG tablet   Oral   Take 1 tablet by mouth 2 (two) times  daily.   20 tablet   0   . apixaban (ELIQUIS) 5 MG TABS tablet   Oral   Take 2 tablets (10 mg total) by mouth 2 (two) times daily.   60 tablet   0   . apixaban (ELIQUIS) 5 MG TABS tablet   Oral   Take 1 tablet (5 mg total) by mouth 2 (two) times daily.   20 tablet   0   . aspirin EC 81 MG tablet   Oral   Take 81 mg by mouth 2 (two) times daily.          . budesonide-formoterol (SYMBICORT) 160-4.5 MCG/ACT inhaler   Inhalation   Inhale 2 puffs into the lungs 2 (two) times daily.         . furosemide (LASIX) 20 MG tablet   Oral   Take 20 mg by mouth daily. Pt is able to take an additional tablet if needed for edema.         Marland Kitchen ipratropium-albuterol (DUONEB) 0.5-2.5 (3) MG/3ML SOLN   Nebulization   Take 3 mLs by nebulization 4 (four) times daily.   100 mL   0   . lisinopril (PRINIVIL,ZESTRIL) 20 MG tablet   Oral   Take 20 mg by mouth daily.         Marland Kitchen  lovastatin (MEVACOR) 20 MG tablet   Oral   Take 20 mg by mouth at bedtime.         . predniSONE (STERAPRED UNI-PAK 21 TAB) 10 MG (21) TBPK tablet   Oral   Take 1 tablet (10 mg total) by mouth daily. 4 tab daily for 3 days 3  Tab daily for 3 days 2 tab daily  for 3 days 1 tab daily for 3 days   30 tablet   0   . senna (SENOKOT) 8.6 MG TABS tablet   Oral   Take 1 tablet (8.6 mg total) by mouth daily.   120 each   0     Allergies Review of patient's allergies indicates no known allergies.  Family History  Problem Relation Age of Onset  . Congestive Heart Failure Brother     Social History Social History  Substance Use Topics  . Smoking status: Former Smoker    Types: Cigarettes    Quit date: 11/13/1988  . Smokeless tobacco: None  . Alcohol Use: No    Review of Systems  Constitutional: Negative for fever. Cardiovascular: Negative for chest pain. Respiratory: Positive for shortness of breath. Gastrointestinal: Negative for abdominal pain, vomiting and diarrhea. Neurological: Negative for  headaches, focal weakness or numbness.  10-point ROS otherwise negative.  ____________________________________________   PHYSICAL EXAM:  VITAL SIGNS: ED Triage Vitals  Enc Vitals Group     BP 11/29/15 1738 141/77 mmHg     Pulse Rate 11/29/15 1738 55     Resp 11/29/15 1738 24     Temp 11/29/15 1738 97.5 F (36.4 C)     Temp Source 11/29/15 1738 Oral     SpO2 11/29/15 1738 95 %     Weight 11/29/15 1738 244 lb 8 oz (110.904 kg)     Height --      Head Cir --      Peak Flow --      Pain Score 11/29/15 1740 0   Constitutional: Alert and oriented. Well appearing and in no distress. Eyes: Conjunctivae are normal. PERRL. Normal extraocular movements. ENT   Head: Normocephalic and atraumatic.   Nose: No congestion/rhinnorhea.   Mouth/Throat: Mucous membranes are moist.   Neck: No stridor. Hematological/Lymphatic/Immunilogical: No cervical lymphadenopathy. Cardiovascular: Normal rate, regular rhythm.  No murmurs, rubs, or gallops. Respiratory: Tachypnea. Increased respiratory effort. Diffuse wheezing. Gastrointestinal: Soft and nontender. No distention. There is no CVA tenderness. Genitourinary: Deferred Musculoskeletal: Normal range of motion in all extremities. No joint effusions.  No lower extremity tenderness nor edema. Neurologic:  Normal speech and language. No gross focal neurologic deficits are appreciated.  Skin:  Skin is warm, dry and intact. No rash noted. Psychiatric: Mood and affect are normal. Speech and behavior are normal. Patient exhibits appropriate insight and judgment.  ____________________________________________    LABS (pertinent positives/negatives)  Labs Reviewed  COMPREHENSIVE METABOLIC PANEL - Abnormal; Notable for the following:    CO2 39 (*)    Glucose, Bld 118 (*)    BUN 32 (*)    ALT 16 (*)    GFR calc non Af Amer 51 (*)    GFR calc Af Amer 60 (*)    Anion gap 4 (*)    All other components within normal limits  CBC - Abnormal;  Notable for the following:    WBC 13.7 (*)    All other components within normal limits  CULTURE, BLOOD (ROUTINE X 2)  CULTURE, BLOOD (ROUTINE X 2)  CULTURE, EXPECTORATED SPUTUM-ASSESSMENT  PROTIME-INR  BRAIN NATRIURETIC PEPTIDE  TROPONIN I  LACTIC ACID, PLASMA  LACTIC ACID, PLASMA  BASIC METABOLIC PANEL  CBC     ____________________________________________   EKG  I, Nance Pear, attending physician, personally viewed and interpreted this EKG  EKG Time: 1740 Rate: 53 Rhythm: sinus bradycardia Axis: left axis deviation Intervals: qtc 412 QRS: LBBB ST changes: no st elevation Impression: abnormal ekg ____________________________________________    RADIOLOGY  CXR  IMPRESSION: 1. Low lung volumes with bibasilar (left greater than right) areas of atelectasis and/or consolidation, with small left pleural effusion. 2. Atherosclerosis.  CT Head IMPRESSION: Age-related involutional change with no acute findings   ____________________________________________   PROCEDURES  Procedure(s) performed: None  Critical Care performed: No  ____________________________________________   INITIAL IMPRESSION / ASSESSMENT AND PLAN / ED COURSE  Pertinent labs & imaging results that were available during my care of the patient were reviewed by me and considered in my medical decision making (see chart for details).  Patient presented to the emergency department today because of family concern for some confusion. Patient also has some slight increase of shortness of breath. Chest x-ray was concerning for possible pneumonia. Patient did have a leukocytosis. Because of this patient was given antibiotics. Will be admitted to the hospitalist service for further workup and management.  ____________________________________________   FINAL CLINICAL IMPRESSION(S) / ED DIAGNOSES  Final diagnoses:  Shortness of breath  Confusion  Community acquired pneumonia     Nance Pear, MD 11/29/15 2344

## 2015-11-30 ENCOUNTER — Other Ambulatory Visit: Payer: Self-pay | Admitting: *Deleted

## 2015-11-30 ENCOUNTER — Telehealth: Payer: Self-pay | Admitting: *Deleted

## 2015-11-30 DIAGNOSIS — L8990 Pressure ulcer of unspecified site, unspecified stage: Secondary | ICD-10-CM | POA: Insufficient documentation

## 2015-11-30 DIAGNOSIS — R911 Solitary pulmonary nodule: Secondary | ICD-10-CM

## 2015-11-30 LAB — CBC
HCT: 42.7 % (ref 40.0–52.0)
HEMOGLOBIN: 13.8 g/dL (ref 13.0–18.0)
MCH: 30.7 pg (ref 26.0–34.0)
MCHC: 32.2 g/dL (ref 32.0–36.0)
MCV: 95.5 fL (ref 80.0–100.0)
PLATELETS: 201 10*3/uL (ref 150–440)
RBC: 4.48 MIL/uL (ref 4.40–5.90)
RDW: 15.2 % — ABNORMAL HIGH (ref 11.5–14.5)
WBC: 7.1 10*3/uL (ref 3.8–10.6)

## 2015-11-30 LAB — BASIC METABOLIC PANEL
ANION GAP: 7 (ref 5–15)
BUN: 30 mg/dL — ABNORMAL HIGH (ref 6–20)
CALCIUM: 9 mg/dL (ref 8.9–10.3)
CHLORIDE: 100 mmol/L — AB (ref 101–111)
CO2: 35 mmol/L — AB (ref 22–32)
CREATININE: 0.95 mg/dL (ref 0.61–1.24)
GFR calc non Af Amer: >60 mL/min (ref >60)
Glucose, Bld: 177 mg/dL — ABNORMAL HIGH (ref 65–99)
Potassium: 4.5 mmol/L (ref 3.5–5.1)
SODIUM: 142 mmol/L (ref 135–145)

## 2015-11-30 MED ORDER — METHYLPREDNISOLONE SODIUM SUCC 125 MG IJ SOLR
60.0000 mg | Freq: Two times a day (BID) | INTRAMUSCULAR | Status: DC
Start: 2015-11-30 — End: 2015-12-02
  Administered 2015-11-30 – 2015-12-02 (×4): 60 mg via INTRAVENOUS
  Filled 2015-11-30 (×4): qty 2

## 2015-11-30 MED ORDER — GUAIFENESIN ER 600 MG PO TB12
600.0000 mg | ORAL_TABLET | Freq: Two times a day (BID) | ORAL | Status: DC
  Administered 2015-11-30 – 2015-12-02 (×5): 600 mg via ORAL
  Filled 2015-11-30 (×5): qty 1

## 2015-11-30 MED ORDER — ZINC OXIDE 20 % EX OINT
TOPICAL_OINTMENT | Freq: Two times a day (BID) | CUTANEOUS | Status: DC
  Administered 2015-11-30 – 2015-12-01 (×2): via TOPICAL
  Filled 2015-11-30: qty 28.35

## 2015-11-30 MED ORDER — IPRATROPIUM-ALBUTEROL 0.5-2.5 (3) MG/3ML IN SOLN
3.0000 mL | Freq: Four times a day (QID) | RESPIRATORY_TRACT | Status: DC
  Administered 2015-11-30 – 2015-12-02 (×10): 3 mL via RESPIRATORY_TRACT
  Filled 2015-11-30 (×10): qty 3

## 2015-11-30 MED ORDER — ZINC OXIDE 11.3 % EX CREA
TOPICAL_CREAM | Freq: Two times a day (BID) | CUTANEOUS | Status: DC
  Filled 2015-11-30: qty 56

## 2015-11-30 MED ORDER — APIXABAN 5 MG PO TABS
5.0000 mg | ORAL_TABLET | Freq: Two times a day (BID) | ORAL | Status: DC
  Administered 2015-11-30 – 2015-12-02 (×4): 5 mg via ORAL
  Filled 2015-11-30 (×4): qty 1

## 2015-11-30 NOTE — Telephone Encounter (Signed)
Discussion with radiologist on 11/18/15 indicated that he had reviewed imaging obtained at [facility] and nodules in question appeared less suspicious. Recommended to have 2 month followup CT scan. Discussed with Dr. Genevive Bi and obtained order which was entered. Left voicemail for son, [name], informing of plan of care and encouraged to call with any concerns or questions.

## 2015-11-30 NOTE — NC FL2 (Signed)
[place] LEVEL OF CARE SCREENING TOOL     IDENTIFICATION  Patient Name: Kirk Hubbard Birthdate: 06/21/1923 Sex: male Admission Date (Current Location): 11/29/2015  [place] and [place] Number:  Engineering geologist and Address:  [facility], [address] [place], [place] [address]      Provider Number: [id number]  Attending Physician Name and Address:  Gladstone Lighter, MD  Relative Name and Phone Number:       Current Level of Care: Hospital Recommended Level of Care: [place] Prior Approval Number:    Date Approved/Denied:   PASRR Number:  ([id number])  Discharge Plan: SNF    Current Diagnoses: Patient Active Problem List   Diagnosis Date Noted  . Pressure ulcer 11/30/2015  . Pneumonia 11/29/2015  . COPD exacerbation ([place]) 11/29/2015  . Cellulitis 10/08/2015  . DVT (deep venous thrombosis) ([place]) 10/08/2015  . Lung nodule seen on imaging study 09/02/2015    Orientation RESPIRATION BLADDER Height & Weight    Self, Time, Situation, Place  O2 (3 Liters oxygen ) Incontinent   244 lbs.  BEHAVIORAL SYMPTOMS/MOOD NEUROLOGICAL BOWEL NUTRITION STATUS   (none )  (none ) Incontinent Diet (Diet: Heart Healthy/ Carb Modified)  AMBULATORY STATUS COMMUNICATION OF NEEDS Skin   Extensive Assist Verbally PU Stage and Appropriate Care (Pressure Ulcer Stage 2: Sacrum. )                       Personal Care Assistance Level of Assistance  Bathing, Feeding, Dressing Bathing Assistance: Limited assistance Feeding assistance: Independent Dressing Assistance: Limited assistance     Functional Limitations Info  Sight, Hearing, Speech Sight Info: Adequate Hearing Info: Adequate Speech Info: Adequate    SPECIAL CARE FACTORS FREQUENCY  PT (By licensed PT)     PT Frequency:  (5)              Contractures      Additional Factors Info  Code Status Code Status Info:  (DNR )             Current  Medications (11/30/2015):  This is the current hospital active medication list Current Facility-Administered Medications  Medication Dose Route Frequency Provider Last Rate Last Dose  . allopurinol (ZYLOPRIM) tablet 300 mg  300 mg Oral Daily Vaughan Basta, MD   300 mg at 11/30/15 0946  . apixaban (ELIQUIS) tablet 5 mg  5 mg Oral BID Gladstone Lighter, MD      . aspirin EC tablet 81 mg  81 mg Oral BID Vaughan Basta, MD   81 mg at 11/30/15 0946  . azithromycin (ZITHROMAX) 250 mg in dextrose 5 % 125 mL IVPB  250 mg Intravenous Q24H Vaughan Basta, MD      . cefTRIAXone (ROCEPHIN) 1 g in dextrose 5 % 50 mL IVPB  1 g Intravenous Q24H Vaughan Basta, MD   Stopped at 11/29/15 2251  . furosemide (LASIX) tablet 40 mg  40 mg Oral Daily Vaughan Basta, MD   40 mg at 11/30/15 0947  . guaiFENesin (MUCINEX) 12 hr tablet 600 mg  600 mg Oral BID Gladstone Lighter, MD      . ipratropium-albuterol (DUONEB) 0.5-2.5 (3) MG/3ML nebulizer solution 3 mL  3 mL Nebulization QID Vaughan Basta, MD   3 mL at 11/30/15 1109  . lisinopril (PRINIVIL,ZESTRIL) tablet 20 mg  20 mg Oral Daily Vaughan Basta, MD   20 mg at 11/30/15 0947  . methylPREDNISolone sodium succinate (SOLU-MEDROL) 125 mg/2  mL injection 60 mg  60 mg Intravenous Q12H Gladstone Lighter, MD      . metoprolol (LOPRESSOR) tablet 50 mg  50 mg Oral BID Vaughan Basta, MD   50 mg at 11/30/15 0951  . pravastatin (PRAVACHOL) tablet 20 mg  20 mg Oral q1800 Vaughan Basta, MD      . senna (SENOKOT) tablet 8.6 mg  1 tablet Oral Daily PRN Vaughan Basta, MD      . tiotropium (SPIRIVA) inhalation capsule 18 mcg  18 mcg Inhalation Daily Vaughan Basta, MD   18 mcg at 11/30/15 [id number]     Discharge Medications: Please see discharge summary for a list of discharge medications.  Relevant Imaging Results:  Relevant Lab Results:   Additional Information  (SSN: [id number])  Loralyn Freshwater,  LCSW

## 2015-11-30 NOTE — Progress Notes (Signed)
Visit made. Patient is currently receiving [facility] home [facility] services of nursing and aide at his home. He was admitted to [facility] on 1/16 for treatment of pneumonia and COPD exacerbation.  Patient seen sitting up in the chair, alert, interactive. Attending Dr. Tressia Miners in during visit. Patient currently receiving IV antibiotics and IV steroids. CMRN Levada Dy made aware of patient's current [facility] services. No family present during visit. Will continue to follow through final disposition. Updated notes sent to referral. [facility] team updated. Flo Shanks RN, BSN, [facility] liaison [phone] c

## 2015-11-30 NOTE — Progress Notes (Signed)
sw Dr Tressia Miners about eliquis order and that pt had Lovenox this am , Dr Tressia Miners will talk with pharmacy about when to begin eliwuis since lovenox was administered today

## 2015-11-30 NOTE — Clinical Social Work Placement (Signed)
   CLINICAL SOCIAL WORK PLACEMENT  NOTE  Date:  11/30/2015  Patient Details  Name: Kirk Hubbard MRN: [id number] Date of Birth: 06/21/1923  Clinical Social Work is seeking post-discharge placement for this patient at the [place] level of care (*CSW will initial, date and re-position this form in  chart as items are completed):  Yes   Patient/family provided with [facility] Work Department's list of facilities offering this level of care within the geographic area requested by the patient (or if unable, by the patient's family).  Yes   Patient/family informed of their freedom to choose among providers that offer the needed level of care, that participate in Medicare, Medicaid or managed care program needed by the patient, have an available bed and are willing to accept the patient.  Yes   Patient/family informed of [facility]'s ownership interest in [facility] and [facility], as well as of the fact that they are under no obligation to receive care at these facilities.  PASRR submitted to EDS on 11/30/15     PASRR number received on 11/30/15     Existing PASRR number confirmed on       FL2 transmitted to all facilities in geographic area requested by pt/family on 11/30/15     FL2 transmitted to all facilities within larger geographic area on       Patient informed that his/her managed care company has contracts with or will negotiate with certain facilities, including the following:            Patient/family informed of bed offers received.  Patient chooses bed at       Physician recommends and patient chooses bed at      Patient to be transferred to   on  .  Patient to be transferred to facility by       Patient family notified on   of transfer.  Name of family member notified:        PHYSICIAN       Additional Comment:    _______________________________________________ Loralyn Freshwater, LCSW 11/30/2015, 3:59 PM

## 2015-11-30 NOTE — Care Management Note (Addendum)
Case Management Note  Patient Details  Name: Kirk Hubbard MRN: [id number] Date of Birth: 06/21/1923  Subjective/Objective:                  Met with patient to discuss discharge planning. He is a pleasant, elderly man with audible rhonchi and chronic O2. His PCP he states is Dr. Ginette Pitman. He was alert and orient to place and situation. He states he has frequent visitors in the home but does live alone. He states he is limited with mobility but does not require a walker. He mentioned his son [name] which works. Patient's wife is deceased. He lives in [place]. Patient was very talkative so I am not sure if he has any DME (other than O2) in the home. This patient is open to [facility] and the are aware of patient admission.  Action/Plan: List of home health agencies left with patient. RNCM will need to speak with son [name] to confirm discharge disposition after PT makes recommendation. Patient has a brother which may be at [facility] for long-term care- Bearl Mulberry. RNCM will continue to follow.   Expected Discharge Date:                  Expected Discharge Plan:     In-House Referral:  Clinical Social Work  Discharge planning Services  CM Consult  Post Acute Care Choice:  Home Health, Durable Medical Equipment Choice offered to:  Patient  DME Arranged:    DME Agency:     HH Arranged:    [place] Agency:     Status of Service:  In process, will continue to follow  Medicare Important Message Given:    Date Medicare IM Given:    Medicare IM give by:    Date Additional Medicare IM Given:    Additional Medicare Important Message give by:     If discussed at [place] of Stay Meetings, dates discussed:    Additional Comments:  Marshell Garfinkel, RN 11/30/2015, 9:41 AM

## 2015-11-30 NOTE — Progress Notes (Signed)
[facility] [place] NAME: Bane Cruces    MR#:  [id number]  DATE OF BIRTH:  06/21/1923  SUBJECTIVE:  CHIEF COMPLAINT:   Chief Complaint  Patient presents with  . Altered Mental Status   - sitting in chair, slightly tachypneic, congested - admitted for resp distress and pneumonia  REVIEW OF SYSTEMS:  Review of Systems  Constitutional: Negative for fever and chills.  HENT: Negative for ear discharge, ear pain and nosebleeds.   Eyes: Negative for blurred vision.  Respiratory: Positive for cough, shortness of breath and wheezing.   Cardiovascular: Positive for leg swelling. Negative for chest pain and palpitations.  Gastrointestinal: Negative for nausea, vomiting, abdominal pain, diarrhea and constipation.  Genitourinary: Negative for dysuria.  Musculoskeletal: Negative for myalgias.  Neurological: Positive for weakness. Negative for dizziness, tremors, speech change, focal weakness, seizures and headaches.  Endo/Heme/Allergies: Does not bruise/bleed easily.  Psychiatric/Behavioral: Negative for depression.    DRUG ALLERGIES:  No Known Allergies  VITALS:  Blood pressure 121/63, pulse 71, temperature 98 F (36.7 C), temperature source Oral, resp. rate 18, weight 110.904 kg (244 lb 8 oz), SpO2 90 %.  PHYSICAL EXAMINATION:  Physical Exam  GENERAL:  82 y.o.-year-old elderly patient lying in the bed with no acute distress. Slightly tachypneic EYES: Pupils equal, round, reactive to light and accommodation. No scleral icterus. Extraocular muscles intact.  HEENT: Head atraumatic, normocephalic. Oropharynx and nasopharynx clear.  NECK:  Supple, no jugular venous distention. No thyroid enlargement, no tenderness.  LUNGS: Congested on exam, diffuse scattered wheezing and also Rales heard especially posteriorly. No rhonchi. No use of accessory muscles for breathing CARDIOVASCULAR: S1, S2 normal. No  rubs, or gallops. 3/6 systolic murmur is  present ABDOMEN: Soft, nontender, nondistended. Bowel sounds present. No organomegaly or mass.  EXTREMITIES: No cyanosis, or clubbing. 1+ pedal edema noted NEUROLOGIC: Cranial nerves II through XII are intact. Muscle strength 5/5 in all extremities. Sensation intact. Gait not checked.  PSYCHIATRIC: The patient is alert and oriented to self. Appears very confused.  SKIN: No obvious rash, lesion, or ulcer.    LABORATORY PANEL:   CBC  Recent Labs Lab 11/30/15 0533  WBC 7.1  HGB 13.8  HCT 42.7  PLT 201   ------------------------------------------------------------------------------------------------------------------  Chemistries   Recent Labs Lab 11/29/15 1753 11/30/15 0533  NA 145 142  K 4.4 4.5  CL 102 100*  CO2 39* 35*  GLUCOSE 118* 177*  BUN 32* 30*  CREATININE 1.20 0.95  CALCIUM 9.2 9.0  AST 17  --   ALT 16*  --   ALKPHOS 82  --   BILITOT 0.5  --    ------------------------------------------------------------------------------------------------------------------  Cardiac Enzymes  Recent Labs Lab 11/29/15 1850  TROPONINI <0.03   ------------------------------------------------------------------------------------------------------------------  RADIOLOGY:  Ct Head Wo Contrast  11/29/2015  CLINICAL DATA:  Altered mental status since 1 a.m. today EXAM: CT HEAD WITHOUT CONTRAST TECHNIQUE: Contiguous axial images were obtained from the base of the skull through the vertex without intravenous contrast. COMPARISON:  10/16/2009 FINDINGS: Calvarium is intact. Study limited by mild motion artifact. Moderate diffuse atrophy is noted. There is mild diffuse low attenuation in the deep white matter. No evidence of mass or vascular territory infarct. No hemorrhage or extra-axial fluid. IMPRESSION: Age-related involutional change with no acute findings Electronically Signed   By: Skipper Cliche M.D.   On: 11/29/2015 19:32   Dg Chest Portable 1 View  11/29/2015  CLINICAL  DATA:  82 year old male with altered  mental status today. Bilateral leg edema. Cough and congestion. EXAM: PORTABLE CHEST 1 VIEW COMPARISON:  Chest x-ray 10/08/2015. FINDINGS: Lung volumes are slightly low. Bibasilar opacities (left greater than right) may reflect areas of atelectasis and/or consolidation. Possible small left pleural effusion. No right pleural effusion. Mild crowding of the pulmonary vasculature, accentuated by low lung volumes, without frank pulmonary edema. Mild cardiomegaly. Upper mediastinal contours are within normal limits. Atherosclerosis in the thoracic aorta. IMPRESSION: 1. Low lung volumes with bibasilar (left greater than right) areas of atelectasis and/or consolidation, with small left pleural effusion. 2. Atherosclerosis. Electronically Signed   By: Vinnie Langton M.D.   On: 11/29/2015 18:24    EKG:   Orders placed or performed during the hospital encounter of 11/29/15  . ED EKG  . ED EKG  . EKG 12-Lead  . EKG 12-Lead    ASSESSMENT AND PLAN:   82 year old male with past medical history significant for hypertension, chronic respiratory failure secondary to COPD on 3 L oxygen, congestive heart failure, degenerative joint disease and DVT on eliquis presents to the hospital secondary to worsening confusion and also cough.  #1 community-acquired pneumonia-chest x-ray with bibasilar infiltrates. -Added Mucinex and flutter valve to help him cough -Continue Lasix. On chronically 3 L home oxygen, currently stable. -Awaiting blood cultures. -Continue Rocephin and azithromycin  #2 COPD exacerbation-decreased dose of IV steroids. -Continue nebs and inhalers. - on oxygen  #3 confusion-likely metabolic encephalopathy from above. -CT of the head with no acute findings. Likely has underlying dementia. -No focal neuro deficits. Continue to monitor.  #4 hypertension-on metoprolol and lisinopril.  #5 H/o DVT-continue eliquis  Physical therapy consult and likely  placement needed    All the records are reviewed and case discussed with Care Management/Social Workerr. Management plans discussed with the patient, family and they are in agreement.  CODE STATUS: Full Code  TOTAL TIME TAKING CARE OF THIS PATIENT: 38 minutes.   POSSIBLE D/C IN 2 DAYS, DEPENDING ON CLINICAL CONDITION.   Gladstone Lighter M.D on 11/30/2015 at 1:31 PM  Between 7am to 6pm - Pager - [phone]  After 6pm go to [email or web address] - password [email or web address] [facility]  Office  [phone]  CC: Primary care physician; Tracie Harrier, MD

## 2015-11-30 NOTE — Progress Notes (Signed)
Initial Nutrition Assessment   INTERVENTION:   Meals and Snacks: Cater to patient preferences; recommend liberalizing diet order to heart healthy only as pt does not have h/o DM, not on insulin Medical Food Supplement Therapy: will send Magic Cup BID   NUTRITION DIAGNOSIS:   Increased nutrient needs related to wound healing as evidenced by estimated needs.  GOAL:   Patient will meet greater than or equal to 90% of their needs  MONITOR:    (Energy Intake, Electrolyte and renal Profile, Glucose Profile, Skin)  REASON FOR ASSESSMENT:    (Pressure Ulcer)    ASSESSMENT:   Pt admitted with AMS and pna. Pt with stage II sacral pressure ulcer.  Past Medical History  Diagnosis Date  . Hypertension   . Hyperlipidemia   . DJD (degenerative joint disease)   . History of gout   . History of kidney stones   . History of prostate cancer   . Renal disorder     kidney stones  . Cancer ([place])   . CHF (congestive heart failure) ([place])   . COPD (chronic obstructive pulmonary disease) ([place])   . DVT (deep venous thrombosis) ([place]) sept 2016     Diet Order:  Diet heart healthy/carb modified Room service appropriate?: Yes; Fluid consistency:: Thin    Current Nutrition: Pt confused could not recall what he had for breakfast this am. Recorded po intake 100% of breakfast.  Food/Nutrition-Related History: Unable to clarify further with pt on visit.   Scheduled Medications:  . allopurinol  300 mg Oral Daily  . apixaban  5 mg Oral BID  . aspirin EC  81 mg Oral BID  . azithromycin  250 mg Intravenous Q24H  . cefTRIAXone (ROCEPHIN)  IV  1 g Intravenous Q24H  . furosemide  40 mg Oral Daily  . guaiFENesin  600 mg Oral BID  . ipratropium-albuterol  3 mL Nebulization QID  . lisinopril  20 mg Oral Daily  . methylPREDNISolone (SOLU-MEDROL) injection  60 mg Intravenous Q12H  . metoprolol  50 mg Oral BID  . pravastatin  20 mg Oral q1800  . tiotropium  18 mcg Inhalation Daily     Electrolyte/Renal Profile and Glucose Profile:   Recent Labs Lab 11/29/15 1753 11/30/15 0533  NA 145 142  K 4.4 4.5  CL 102 100*  CO2 39* 35*  BUN 32* 30*  CREATININE 1.20 0.95  CALCIUM 9.2 9.0  GLUCOSE 118* 177*   Protein Profile:  Recent Labs Lab 11/29/15 1753  ALBUMIN 3.9    Gastrointestinal Profile: Last BM:  11/29/2015   Nutrition-Focused Physical Exam Findings:  Unable to complete Nutrition-Focused physical exam at this time.    Weight Change: Unable to clarify with pt however per [facility] weight loss 5% in 3 months  Skin:   (Stage II Sacral Pressure Ulcer)   Height:   Ht Readings from Last 1 Encounters:  10/08/15 6' (1.829 m)    Weight:   Wt Readings from Last 1 Encounters:  11/29/15 244 lb 8 oz (110.904 kg)   Wt Readings from Last 10 Encounters:  11/29/15 244 lb 8 oz (110.904 kg)  10/08/15 249 lb 6.4 oz (113.127 kg)  08/19/15 257 lb 15 oz (117 kg)     BMI:  Body mass index is 33.15 kg/(m^2).  Estimated Nutritional Needs:   Kcal:  BEE: 1502kcals, TEE: (IF 1.1-1.3)(AF 1.2) 1982-2343kcals, using IBw of 80.9kg  Protein:  97-121g protein, (1.2-1.5g/kg)  Fluid:  2022-2463mL of fluid (25-17mL/kg)  EDUCATION NEEDS:  No education needs identified at this time   Clawson, RD, LDN Pager [phone] Weekend/On-Call Pager [phone]

## 2015-11-30 NOTE — Clinical Social Work Note (Signed)
Clinical Social Work Assessment  Patient Details  Name: Kirk Hubbard MRN: [id number] Date of Birth: 06/21/1923  Date of referral:  11/30/15               Reason for consult:  Facility Placement                Permission sought to share information with:  Chartered certified accountant granted to share information::  Yes, Verbal Permission Granted  Name::      [place]::   [place]   Relationship::     Contact Information:     Housing/Transportation Living arrangements for the past 2 months:  [place] of Information:  Patient, Adult Children Patient Interpreter Needed:  None Criminal Activity/Legal Involvement Pertinent to Current Situation/Hospitalization:  No - Comment as needed Significant Relationships:  Adult Children Lives with:  Self Do you feel safe going back to the place where you live?  Yes Need for family participation in patient care:  Yes (Comment)  Care giving concerns:  Patient lives in [place] alone.    Social Worker assessment / plan:  [place] representative (CSW) received SNF consult. PT is recommending SNF. CSW met with patient and he had a room full of visitors. Patient's sons [name] and Merry Proud were at bedside along with patient's sister in law Opal Sidles. CSW introduced self and explained role of CSW department. Patient was alert and oriented and sitting up in the chair. Patient reported that he lives alone in [place]. CSW explained that PT is recommending short term rehab at a SNF. Patient is agreeable to SNF search in [facility]. SNF list was provided. CSW explained SNF process. Patient's son [name] requested [facility]. Per [facility] is where patient's brother Davidmichael Zarazua lives and his granddaughter [name] is a PT there. CSW explained to son that patient will need a 3 night qualifying inpatient stay in order for Medicare to pay for SNF.   FL2 complete and faxed out. CSW contacted [place]  admissions coordinator at [facility] and asked her to review referral.   Employment status:  Retired Forensic scientist:  Medicare PT Recommendations:  [place] / Referral to community resources:  [place]  Patient/Family's Response to care:  Patient and son are agreeable to [facility].   Patient/Family's Understanding of and Emotional Response to Diagnosis, Current Treatment, and Prognosis: Patient and son were pleasant throughout assessment.   Emotional Assessment Appearance:  Appears stated age Attitude/Demeanor/Rapport:    Affect (typically observed):  Accepting, Adaptable, Pleasant Orientation:  Oriented to Self, Oriented to Place, Oriented to  Time, Oriented to Situation Alcohol / Substance use:  Not Applicable Psych involvement (Current and /or in the community):  No (Comment)  Discharge Needs  Concerns to be addressed:  Discharge Planning Concerns Readmission within the last 30 days:  No Current discharge risk:  Dependent with Mobility Barriers to Discharge:  Continued Medical Work up   Loralyn Freshwater, LCSW 11/30/2015, 4:01 PM

## 2015-11-30 NOTE — Evaluation (Signed)
Physical Therapy Evaluation Patient Details Name: Kirk Hubbard MRN: [id number] DOB: 06/21/1923 Today's Date: 11/30/2015   History of Present Illness  Pt is a 82 y.o. male presenting to hospital with AMS.  Pt admitted with PNA and COPD exacerbation causing acute on chronic respiratory failure.  PMH includes DVT 2016, CHF, COPD on chronic 3 L O2 at home, and prostate CA.  Clinical Impression  Pt appearing confused during session and had difficulty answering any questions regarding PLOF and home set-up (per last PT note, pt was living at home alone in November 2016).  Currently pt is mod assist supine to sit; mod to max assist to stand with RW; and min to mod assist x2 to ambulate 12 feet with RW.  Pt would benefit from skilled PT to address noted impairments and functional limitations.  Recommend pt discharge to STR when medically appropriate.     Follow Up Recommendations SNF    Equipment Recommendations  Rolling walker with 5" wheels    Recommendations for Other Services       Precautions / Restrictions Precautions Precautions: Fall Restrictions Weight Bearing Restrictions: No      Mobility  Bed Mobility Overal bed mobility: Needs Assistance Bed Mobility: Supine to Sit     Supine to sit: Mod assist;HOB elevated     General bed mobility comments: assist for trunk and B LE's  Transfers Overall transfer level: Needs assistance Equipment used: Rolling walker (2 wheeled) Transfers: Sit to/from Stand Sit to Stand: Mod assist;Max assist;+2 safety/equipment (x3 trials)         General transfer comment: vc's and tactile cues required for hand and feet placement; increased assist required with repetitive stands  Ambulation/Gait Ambulation/Gait assistance: Min assist;Mod assist;+2 physical assistance;+2 safety/equipment Ambulation Distance (Feet): 12 Feet Assistive device: Rolling walker (2 wheeled)   Gait velocity: decreased   General Gait Details: pt with R lean,  decreased B step length/foot clearance/heelstrike, vc's and assist required for walker use and safety  Stairs            Wheelchair Mobility    Modified Rankin (Stroke Patients Only)       Balance Overall balance assessment: Needs assistance Sitting-balance support: Bilateral upper extremity supported;Feet supported Sitting balance-Leahy Scale: Fair   Postural control: Right lateral lean Standing balance support: Bilateral upper extremity supported (on RW) Standing balance-Leahy Scale: Fair Standing balance comment: R lean noted in standing                             Pertinent Vitals/Pain Pain Assessment: No/denies pain  Vitals stable and WFL throughout treatment session on 3 L/min O2 via nasal cannula.    Home Living Family/patient expects to be discharged to:: Private residence                 Additional Comments: Pt reports coming from home but had difficulty describing living situation, home setup, services being provided, or assist levels.  Per PT note in November 2016, pt lives alone in house with stairs to enter and family able to assist pt    Prior Function           Comments: Per PT note in November 2016, pt uses a cane at baseline and was relatively active and independent (pt had difficulty providing any of this information today)     Hand Dominance        Extremity/Trunk Assessment   Upper Extremity Assessment: Generalized weakness  Lower Extremity Assessment: Generalized weakness         Communication   Communication: No difficulties  Cognition Arousal/Alertness: Awake/alert Behavior During Therapy: Impulsive Overall Cognitive Status: Impaired/Different from baseline Area of Impairment: Orientation;Following commands Orientation Level: Disoriented to;Place;Time;Situation     Following Commands: Follows multi-step commands inconsistently            General Comments   Nursing cleared pt for  participation in physical therapy.  Pt agreeable to PT session.    Exercises        Assessment/Plan    PT Assessment Patient needs continued PT services  PT Diagnosis Difficulty walking;Generalized weakness   PT Problem List Decreased strength;Decreased activity tolerance;Decreased balance;Decreased mobility;Decreased cognition;Decreased knowledge of use of DME;Decreased safety awareness;Decreased knowledge of precautions;Cardiopulmonary status limiting activity  PT Treatment Interventions DME instruction;Gait training;Stair training;Functional mobility training;Therapeutic activities;Therapeutic exercise;Balance training;Patient/family education   PT Goals (Current goals can be found in the Care Plan section) Acute Rehab PT Goals Patient Stated Goal: pt did not clearly state any goal when asked    Frequency Min 2X/week   Barriers to discharge Decreased caregiver support      Co-evaluation               End of Session Equipment Utilized During Treatment: Gait belt;Oxygen Activity Tolerance: Patient limited by fatigue Patient left: in chair;with call bell/phone within reach;with chair alarm set;with nursing/sitter in room;with SCD's reapplied Nurse Communication: Mobility status;Precautions         Time: [id number] PT Time Calculation (min) (ACUTE ONLY): 26 min   Charges:   PT Evaluation $PT Eval Moderate Complexity: 1 Procedure     PT G CodesLeitha Bleak 2015/12/20, 1:55 PM Leitha Bleak, [place]

## 2015-12-01 ENCOUNTER — Inpatient Hospital Stay: Payer: Medicare Other

## 2015-12-01 LAB — CBC
HEMATOCRIT: 38.1 % — AB (ref 40.0–52.0)
Hemoglobin: 12.2 g/dL — ABNORMAL LOW (ref 13.0–18.0)
MCH: 29.5 pg (ref 26.0–34.0)
MCHC: 32.1 g/dL (ref 32.0–36.0)
MCV: 92 fL (ref 80.0–100.0)
PLATELETS: 200 10*3/uL (ref 150–440)
RBC: 4.14 MIL/uL — ABNORMAL LOW (ref 4.40–5.90)
RDW: 14.5 % (ref 11.5–14.5)
WBC: 12.9 10*3/uL — AB (ref 3.8–10.6)

## 2015-12-01 MED ORDER — NYSTATIN 100000 UNIT/GM EX POWD
Freq: Two times a day (BID) | CUTANEOUS | Status: DC
  Administered 2015-12-01 – 2015-12-02 (×3): via TOPICAL
  Filled 2015-12-01: qty 15

## 2015-12-01 NOTE — Progress Notes (Signed)
Clinical Education officer, museum (CSW) contacted patient's son [name] and presented bed offers. Son chose [facility]. Deborah admissions coordinator at [facility] is aware of accepted bed offer. CSW will continue to follow and assist as needed.   Blima Rich, LCSW [phone]

## 2015-12-01 NOTE — Progress Notes (Signed)
Visit made. Patient seen sitting up in bed, lunch tray in place. Patient needed assistance with tray set up, he was noted to have right hand tremors and reported he "tried to eat with" his left hand. Patient denied pain, reports feeling "better" today. Per chart note review and discussion with CSW Blima Rich, patient and family have chosen for him to discharge to [facility] for short term rehab. Will continue to follow through final disposition. Life path team updated. Flo Shanks RN, BSN, [facility] Liaison [phone] c

## 2015-12-01 NOTE — Clinical Documentation Improvement (Signed)
Internal Medicine  Can the diagnosis of CHF be further specified?    Acuity - Acute, Chronic, Acute on Chronic   Type - Systolic, Diastolic, Systolic and Diastolic  Other  Clinically Undetermined  Please exercise your independent, professional judgment when responding. A specific answer is not anticipated or expected.   Thank You,  Jerah Esty J Kayen Grabel, RN, BSN Health Information Management [place] [phone] 

## 2015-12-01 NOTE — Care Management Important Message (Signed)
Important Message  Patient Details  Name: Kirk Hubbard MRN: [id number] Date of Birth: 06/21/1923   Medicare Important Message Given:  Yes    Juliann Pulse A Hazelyn Kallen 12/01/2015, 9:31 AM

## 2015-12-01 NOTE — Progress Notes (Signed)
Patient is stable,not confused this shift,awaiting blood cultures,continue on lasix and steriods,possible discharge tomorrow if stable.

## 2015-12-01 NOTE — Progress Notes (Signed)
Up to chair tol well pt is 2 assist back to bed no complaints of pain

## 2015-12-01 NOTE — Progress Notes (Addendum)
[facility] [place] NAME: Kirk Hubbard    MR#:  [id number]  DATE OF BIRTH:  06/21/1923  SUBJECTIVE:  CHIEF COMPLAINT:   Chief Complaint  Patient presents with  . Altered Mental Status   - admitted for resp distress and pneumonia - appears better today, still has some cough, has several complaints about being sick several times lately and several recent hospitalisations  REVIEW OF SYSTEMS:  Review of Systems  Constitutional: Negative for fever and chills.  HENT: Negative for ear discharge, ear pain and nosebleeds.   Eyes: Negative for blurred vision.  Respiratory: Positive for cough, shortness of breath and wheezing.   Cardiovascular: Positive for leg swelling. Negative for chest pain and palpitations.  Gastrointestinal: Negative for nausea, vomiting, abdominal pain, diarrhea and constipation.  Genitourinary: Negative for dysuria.  Musculoskeletal: Negative for myalgias.  Neurological: Positive for weakness. Negative for dizziness, tremors, speech change, focal weakness, seizures and headaches.  Endo/Heme/Allergies: Does not bruise/bleed easily.  Psychiatric/Behavioral: Negative for depression.    DRUG ALLERGIES:  No Known Allergies  VITALS:  Blood pressure 135/46, pulse 57, temperature 98.2 F (36.8 C), temperature source Oral, resp. rate 17, weight 110.904 kg (244 lb 8 oz), SpO2 94 %.  PHYSICAL EXAMINATION:  Physical Exam  GENERAL:  82 y.o.-year-old elderly patient lying in the bed with no acute distress.  EYES: Pupils equal, round, reactive to light and accommodation. No scleral icterus. Extraocular muscles intact.  HEENT: Head atraumatic, normocephalic. Oropharynx and nasopharynx clear.  NECK:  Supple, no jugular venous distention. No thyroid enlargement, no tenderness.  LUNGS: Congested on exam, diffuse scattered wheezing and also Rales heard especially posteriorly. No rhonchi. No use of accessory muscles for  breathing CARDIOVASCULAR: S1, S2 normal. No  rubs, or gallops. 3/6 systolic murmur is present ABDOMEN: Soft, nontender, nondistended. Bowel sounds present. No organomegaly or mass.  EXTREMITIES: No cyanosis, or clubbing. 1+ pedal edema noted NEUROLOGIC: Cranial nerves II through XII are intact. Muscle strength 5/5 in all extremities. Sensation intact. Gait not checked.  PSYCHIATRIC: The patient is alert and oriented to self. Appears confused. Likely underlying dementia. SKIN: No obvious rash, lesion, or ulcer.    LABORATORY PANEL:   CBC  Recent Labs Lab 12/01/15 0630  WBC 12.9*  HGB 12.2*  HCT 38.1*  PLT 200   ------------------------------------------------------------------------------------------------------------------  Chemistries   Recent Labs Lab 11/29/15 1753 11/30/15 0533  NA 145 142  K 4.4 4.5  CL 102 100*  CO2 39* 35*  GLUCOSE 118* 177*  BUN 32* 30*  CREATININE 1.20 0.95  CALCIUM 9.2 9.0  AST 17  --   ALT 16*  --   ALKPHOS 82  --   BILITOT 0.5  --    ------------------------------------------------------------------------------------------------------------------  Cardiac Enzymes  Recent Labs Lab 11/29/15 1850  TROPONINI <0.03   ------------------------------------------------------------------------------------------------------------------  RADIOLOGY:  Ct Head Wo Contrast  11/29/2015  CLINICAL DATA:  Altered mental status since 1 a.m. today EXAM: CT HEAD WITHOUT CONTRAST TECHNIQUE: Contiguous axial images were obtained from the base of the skull through the vertex without intravenous contrast. COMPARISON:  10/16/2009 FINDINGS: Calvarium is intact. Study limited by mild motion artifact. Moderate diffuse atrophy is noted. There is mild diffuse low attenuation in the deep white matter. No evidence of mass or vascular territory infarct. No hemorrhage or extra-axial fluid. IMPRESSION: Age-related involutional change with no acute findings Electronically  Signed   By: Skipper Cliche M.D.   On: 11/29/2015 19:32   Dg  Chest Portable 1 View  11/29/2015  CLINICAL DATA:  82 year old male with altered mental status today. Bilateral leg edema. Cough and congestion. EXAM: PORTABLE CHEST 1 VIEW COMPARISON:  Chest x-ray 10/08/2015. FINDINGS: Lung volumes are slightly low. Bibasilar opacities (left greater than right) may reflect areas of atelectasis and/or consolidation. Possible small left pleural effusion. No right pleural effusion. Mild crowding of the pulmonary vasculature, accentuated by low lung volumes, without frank pulmonary edema. Mild cardiomegaly. Upper mediastinal contours are within normal limits. Atherosclerosis in the thoracic aorta. IMPRESSION: 1. Low lung volumes with bibasilar (left greater than right) areas of atelectasis and/or consolidation, with small left pleural effusion. 2. Atherosclerosis. Electronically Signed   By: Vinnie Langton M.D.   On: 11/29/2015 18:24    EKG:   Orders placed or performed during the hospital encounter of 11/29/15  . ED EKG  . ED EKG  . EKG 12-Lead  . EKG 12-Lead    ASSESSMENT AND PLAN:   82 year old male with past medical history significant for hypertension, chronic respiratory failure secondary to COPD on 3 L oxygen, congestive heart failure, degenerative joint disease and DVT on eliquis presents to the hospital secondary to worsening confusion and also cough.  #1 community-acquired pneumonia-chest x-ray with bibasilar infiltrates. -Added Mucinex and flutter valve to help him cough -Continue Lasix. On chronically 3 L home oxygen, currently stable. -Awaiting blood cultures. F/u CXR today -Continue Rocephin and azithromycin  #2 COPD exacerbation-decreased dose of IV steroids. Cont bid dosing today -Continue nebs and inhalers. - on oxygen- on chronic home o2 at 3L  #3 Chronic combined systolic and diastolic CHF- stable for now EF 50%, on lasix q daily monitor for now  #3 confusion-likely  metabolic encephalopathy from above. -CT of the head with no acute findings. Also has underlying dementia. -No focal neuro deficits. Continue to monitor.  #4 hypertension-on metoprolol and lisinopril.  #5 H/o DVT-continue eliquis  Physical therapy consult and likely placement needed.   All the records are reviewed and case discussed with Care Management/Social Workerr. Management plans discussed with the patient, family and they are in agreement.  CODE STATUS: Full Code  TOTAL TIME TAKING CARE OF THIS PATIENT: 38 minutes.   POSSIBLE D/C Tomorrow, DEPENDING ON CLINICAL CONDITION.   Gladstone Lighter M.D on 12/01/2015 at 1:15 PM  Between 7am to 6pm - Pager - [phone]  After 6pm go to [email or web address] - password [email or web address] [facility]  Office  [phone]  CC: Primary care physician; Tracie Harrier, MD

## 2015-12-01 NOTE — Progress Notes (Signed)
Assumed care of pt bed and linen saturated with urine , bedbath given and linens changed

## 2015-12-01 NOTE — Progress Notes (Signed)
Physical Therapy Treatment Patient Details Name: Kirk Hubbard MRN: [id number] DOB: 06/21/1923 Today's Date: 12/01/2015    History of Present Illness Pt is a 82 y.o. male presenting to hospital with AMS.  Pt admitted with PNA and COPD exacerbation causing acute on chronic respiratory failure.  PMH includes DVT 2016, CHF, COPD on chronic 3 L O2 at home, and prostate CA.    PT Comments    Patient requiring less physical assist with all mobility this date (bed mobility, basic transfers and mobility), but remains generally unsteady and unsafe to attempt without +2 assist at this time.  Continue to recommend STR at discharge.   Follow Up Recommendations  SNF     Equipment Recommendations  Rolling walker with 5" wheels    Recommendations for Other Services       Precautions / Restrictions Precautions Precautions: Fall Restrictions Weight Bearing Restrictions: No    Mobility  Bed Mobility Overal bed mobility: Needs Assistance Bed Mobility: Supine to Sit     Supine to sit: Supervision     General bed mobility comments: cuing for hand placement and sequencing  Transfers Overall transfer level: Needs assistance Equipment used: Rolling walker (2 wheeled) Transfers: Sit to/from Stand Sit to Stand: Min assist;+2 physical assistance         General transfer comment: cuing for hand placement; multiple attempts to achieve sit/stand  Ambulation/Gait Ambulation/Gait assistance: Min assist Ambulation Distance (Feet): 25 Feet Assistive device: Rolling walker (2 wheeled)       General Gait Details: broad BOS with limited heel strike/toe off, significant forward trunk flexion with downward gaze; decreased balance/stability   Stairs            Wheelchair Mobility    Modified Rankin (Stroke Patients Only)       Balance Overall balance assessment: Needs assistance Sitting-balance support: No upper extremity supported;Feet supported Sitting balance-Leahy Scale:  Good     Standing balance support: Bilateral upper extremity supported Standing balance-Leahy Scale: Fair                      Cognition Arousal/Alertness: Awake/alert Behavior During Therapy: WFL for tasks assessed/performed                        Exercises Other Exercises Other Exercises: Seated LE therex, 1x15, AROM for muscular strength/endurance with functional activities; min cuing for slow, controlled movement to maximize effectiveness    General Comments        Pertinent Vitals/Pain Pain Assessment: No/denies pain    Home Living                      Prior Function            PT Goals (current goals can now be found in the care plan section) Acute Rehab PT Goals Patient Stated Goal: pt did not clearly state any goal when asked Progress towards PT goals: Progressing toward goals    Frequency  Min 2X/week    PT Plan Current plan remains appropriate    Co-evaluation             End of Session Equipment Utilized During Treatment: Gait belt;Oxygen Activity Tolerance: Patient tolerated treatment well Patient left: in chair;with call bell/phone within reach;with chair alarm set;with family/visitor present     Time: [id number] PT Time Calculation (min) (ACUTE ONLY): 29 min  Charges:  $Gait Training: 8-22 mins $Therapeutic Exercise: 8-22 mins  G Codes:      Brenlynn Fake H. Owens Shark, PT, DPT, NCS 12/01/2015, 4:48 PM [phone]

## 2015-12-02 LAB — BASIC METABOLIC PANEL
Anion gap: 7 (ref 5–15)
BUN: 49 mg/dL — AB (ref 6–20)
CALCIUM: 9.1 mg/dL (ref 8.9–10.3)
CHLORIDE: 100 mmol/L — AB (ref 101–111)
CO2: 34 mmol/L — AB (ref 22–32)
CREATININE: 1.02 mg/dL (ref 0.61–1.24)
GFR calc non Af Amer: >60 mL/min (ref >60)
GLUCOSE: 195 mg/dL — AB (ref 65–99)
Potassium: 4.2 mmol/L (ref 3.5–5.1)
Sodium: 141 mmol/L (ref 135–145)

## 2015-12-02 MED ORDER — NYSTATIN 100000 UNIT/GM EX POWD: CUTANEOUS | Status: AC

## 2015-12-02 MED ORDER — AMOXICILLIN-POT CLAVULANATE 875-125 MG PO TABS
1.0000 | ORAL_TABLET | Freq: Two times a day (BID) | ORAL | Status: DC
  Administered 2015-12-02: 1 via ORAL
  Filled 2015-12-02: qty 1

## 2015-12-02 MED ORDER — PREDNISONE 10 MG (21) PO TBPK: 10.0000 mg | ORAL_TABLET | Freq: Every day | ORAL | Status: DC

## 2015-12-02 MED ORDER — AMOXICILLIN-POT CLAVULANATE 875-125 MG PO TABS: 1.0000 | ORAL_TABLET | Freq: Two times a day (BID) | ORAL | Status: DC

## 2015-12-02 MED ORDER — TIOTROPIUM BROMIDE MONOHYDRATE 18 MCG IN CAPS: 18.0000 ug | ORAL_CAPSULE | Freq: Every day | RESPIRATORY_TRACT | Status: AC

## 2015-12-02 MED ORDER — GUAIFENESIN ER 600 MG PO TB12: 600.0000 mg | ORAL_TABLET | Freq: Two times a day (BID) | ORAL | Status: AC

## 2015-12-02 NOTE — Progress Notes (Signed)
Report called to debbie at facility. EMS called. Waiting on transport.

## 2015-12-02 NOTE — Progress Notes (Signed)
Patient is medically stable for D/C to [facility] today. Per Neoma Laming admissions coordinator at [facility] patient is going to room 324. RN will call report to [facility] and arrange EMS for transport. Clinical Education officer, museum (CSW) sent D/C Summary, FL2 and D/C packet to [employer] via [employer]. Patient is aware of above. CSW contacted patient's son [name] and made him aware of above. Please reconsult if future social work needs arise. CSW signing off.   Blima Rich, LCSW [phone]

## 2015-12-02 NOTE — Progress Notes (Signed)
Visit made. Patient sitting up in bed, very talkative, loose nonproductive cough noted. Patient has been using his flutter valve, which her reports helps "get the junk out". Per patient report and discussion with CSW Blima Rich, plan is for patient to discharge today to [facility] for short term rehab. Will continue to follow through final disposition. Thank you. Flo Shanks RN, BSN, [facility] Liaison [phone] c

## 2015-12-02 NOTE — Clinical Social Work Placement (Signed)
   CLINICAL SOCIAL WORK PLACEMENT  NOTE  Date:  12/02/2015  Patient Details  Name: Kirk Hubbard MRN: [id number] Date of Birth: 06/21/1923  Clinical Social Work is seeking post-discharge placement for this patient at the [place] level of care (*CSW will initial, date and re-position this form in  chart as items are completed):  Yes   Patient/family provided with [place] Work Department's list of facilities offering this level of care within the geographic area requested by the patient (or if unable, by the patient's family).  Yes   Patient/family informed of their freedom to choose among providers that offer the needed level of care, that participate in Medicare, Medicaid or managed care program needed by the patient, have an available bed and are willing to accept the patient.  Yes   Patient/family informed of [facility]'s ownership interest in [facility] and [facility], as well as of the fact that they are under no obligation to receive care at these facilities.  PASRR submitted to EDS on 11/30/15     PASRR number received on 11/30/15     Existing PASRR number confirmed on       FL2 transmitted to all facilities in geographic area requested by pt/family on 11/30/15     FL2 transmitted to all facilities within larger geographic area on       Patient informed that his/her managed care company has contracts with or will negotiate with certain facilities, including the following:        Yes   Patient/family informed of bed offers received.  Patient chooses bed at  [facility] )     Physician recommends and patient chooses bed at      Patient to be transferred to  [facility] ) on 12/02/15.  Patient to be transferred to facility by  [facility] )     Patient family notified on 12/02/15 of transfer.  Name of family member notified:   (Patient's son [name] is aware of D/C today. )     PHYSICIAN       Additional  Comment:    _______________________________________________ Loralyn Freshwater, LCSW 12/02/2015, 2:01 PM

## 2015-12-02 NOTE — Discharge Summary (Signed)
[facility] [place] NAME: Kirk Hubbard    MR#:  [id number]  DATE OF BIRTH:  06/21/1923  DATE OF ADMISSION:  11/29/2015 ADMITTING PHYSICIAN: Vaughan Basta, MD  DATE OF DISCHARGE: 12/02/15  PRIMARY CARE PHYSICIAN: Tracie Harrier, MD    ADMISSION DIAGNOSIS:  Shortness of breath [R06.02] Confusion [R41.0] Community acquired pneumonia [J18.9]  DISCHARGE DIAGNOSIS:  Principal Problem:   Pneumonia Active Problems:   COPD exacerbation ([place])   Pressure ulcer   SECONDARY DIAGNOSIS:   Past Medical History  Diagnosis Date  . Hypertension   . Hyperlipidemia   . DJD (degenerative joint disease)   . History of gout   . History of kidney stones   . History of prostate cancer   . Renal disorder     kidney stones  . Cancer ([place])   . CHF (congestive heart failure) ([place])   . COPD (chronic obstructive pulmonary disease) ([place])   . DVT (deep venous thrombosis) ([place]) sept 2016    HOSPITAL COURSE:    82 year old male with past medical history significant for hypertension, chronic respiratory failure secondary to COPD on 3 L oxygen, congestive heart failure, degenerative joint disease and DVT on eliquis presents to the hospital secondary to worsening confusion and also cough.  #1 community-acquired pneumonia-chest x-ray with bibasilar infiltrates. -Added Mucinex and flutter valve to help him cough -Continue Lasix. On chronically 3 L home oxygen, currently stable. -negative blood cultures.  - change ABX to augmentin at discharge  #2 COPD exacerbation- on oral prednisone taper -Continue nebs and inhalers. - on oxygen- on chronic home o2 at 3L  #3 Chronic combined systolic and diastolic CHF- stable for now EF 50%, on lasix q daily monitor for now  #3 confusion-likely metabolic encephalopathy from above. Resolved and at baseline. -CT of the head with no acute findings. Also has underlying dementia. -No focal neuro deficits.  Continue to monitor.  #4 hypertension-on metoprolol and lisinopril.  #5 H/o DVT-continue eliquis   Physical therapy recommended rehab- discharge to [facility] today   DISCHARGE CONDITIONS:   Guarded  CONSULTS OBTAINED:   None  DRUG ALLERGIES:  No Known Allergies  DISCHARGE MEDICATIONS:   Current Discharge Medication List    START taking these medications   Details  guaiFENesin (MUCINEX) 600 MG 12 hr tablet Take 1 tablet (600 mg total) by mouth 2 (two) times daily. Qty: 60 tablet, Refills: 0    nystatin (MYCOSTATIN/NYSTOP) 100000 UNIT/GM POWD Apply twice a day- in groin and under abdominal folds Qty: 15 g, Refills: 0    tiotropium (SPIRIVA) 18 MCG inhalation capsule Place 1 capsule (18 mcg total) into inhaler and inhale daily. Qty: 30 capsule, Refills: 12      CONTINUE these medications which have CHANGED   Details  amoxicillin-clavulanate (AUGMENTIN) 875-125 MG tablet Take 1 tablet by mouth every 12 (twelve) hours. X 6 more days Qty: 12 tablet, Refills: 0    predniSONE (STERAPRED UNI-PAK 21 TAB) 10 MG (21) TBPK tablet Take 1 tablet (10 mg total) by mouth daily. 6 tabs PO x 1 day 5 tabs PO x 1 day 4 tabs PO x 1 day 3 tabs PO x 1 day 2 tabs PO x 1 day 1 tab PO x 1 day and stop Qty: 21 tablet, Refills: 0      CONTINUE these medications which have NOT CHANGED   Details  allopurinol (ZYLOPRIM) 300 MG tablet Take 300 mg by mouth daily.    aspirin  EC 81 MG tablet Take 81 mg by mouth 2 (two) times daily.     budesonide-formoterol (SYMBICORT) 160-4.5 MCG/ACT inhaler Inhale 2 puffs into the lungs 2 (two) times daily.    furosemide (LASIX) 20 MG tablet Take 40 mg by mouth daily.     ipratropium-albuterol (DUONEB) 0.5-2.5 (3) MG/3ML SOLN Take 3 mLs by nebulization 4 (four) times daily. Qty: 100 mL, Refills: 0    lisinopril (PRINIVIL,ZESTRIL) 20 MG tablet Take 20 mg by mouth daily.    lovastatin (MEVACOR) 20 MG tablet Take 20 mg by mouth at bedtime.      metoprolol (LOPRESSOR) 50 MG tablet Take 50 mg by mouth 2 (two) times daily.    apixaban (ELIQUIS) 5 MG TABS tablet Take 1 tablet (5 mg total) by mouth 2 (two) times daily. Qty: 20 tablet, Refills: 0    senna (SENOKOT) 8.6 MG TABS tablet Take 1 tablet (8.6 mg total) by mouth daily. Qty: 120 each, Refills: 0      STOP taking these medications     albuterol (PROVENTIL HFA;VENTOLIN HFA) 108 (90 BASE) MCG/ACT inhaler          DISCHARGE INSTRUCTIONS:   1. PCP f/u in 1-2 weeks 2. Use flutter valve 3. Physical therapy  If you experience worsening of your admission symptoms, develop shortness of breath, life threatening emergency, suicidal or homicidal thoughts you must seek medical attention immediately by calling 911 or calling your MD immediately  if symptoms less severe.  You Must read complete instructions/literature along with all the possible adverse reactions/side effects for all the Medicines you take and that have been prescribed to you. Take any new Medicines after you have completely understood and accept all the possible adverse reactions/side effects.   Please note  You were cared for by a hospitalist during your hospital stay. If you have any questions about your discharge medications or the care you received while you were in the hospital after you are discharged, you can call the unit and asked to speak with the hospitalist on call if the hospitalist that took care of you is not available. Once you are discharged, your primary care physician will handle any further medical issues. Please note that NO REFILLS for any discharge medications will be authorized once you are discharged, as it is imperative that you return to your primary care physician (or establish a relationship with a primary care physician if you do not have one) for your aftercare needs so that they can reassess your need for medications and monitor your lab values.    Today   CHIEF COMPLAINT:   Chief  Complaint  Patient presents with  . Altered Mental Status    VITAL SIGNS:  Blood pressure 121/51, pulse 60, temperature 97.7 F (36.5 C), temperature source Oral, resp. rate 18, weight 110.904 kg (244 lb 8 oz), SpO2 89 %.  I/O:   Intake/Output Summary (Last 24 hours) at 12/02/15 1309 Last data filed at 12/02/15 1121  Gross per 24 hour  Intake    720 ml  Output      0 ml  Net    720 ml    PHYSICAL EXAMINATION:   Physical Exam  GENERAL: 82 y.o.-year-old elderly patient lying in the bed with no acute distress.  EYES: Pupils equal, round, reactive to light and accommodation. No scleral icterus. Extraocular muscles intact.  HEENT: Head atraumatic, normocephalic. Oropharynx and nasopharynx clear.  NECK: Supple, no jugular venous distention. No thyroid enlargement, no tenderness.  LUNGS:  Congested on exam, diffuse scattered wheezing and also Rales heard especially posteriorly. No rhonchi. No use of accessory muscles for breathing CARDIOVASCULAR: S1, S2 normal. No rubs, or gallops. 3/6 systolic murmur is present ABDOMEN: Soft, nontender, nondistended. Bowel sounds present. No organomegaly or mass.  EXTREMITIES: No cyanosis, or clubbing. 1+ pedal edema noted NEUROLOGIC: Cranial nerves II through XII are intact. Muscle strength 5/5 in all extremities. Sensation intact. Gait not checked.  PSYCHIATRIC: The patient is alert and oriented to self. Appears confused. Likely underlying dementia. SKIN: No obvious rash, lesion, or ulcer.   DATA REVIEW:   CBC  Recent Labs Lab 12/01/15 0630  WBC 12.9*  HGB 12.2*  HCT 38.1*  PLT 200    Chemistries   Recent Labs Lab 11/29/15 1753  12/02/15 0620  NA 145  < > 141  K 4.4  < > 4.2  CL 102  < > 100*  CO2 39*  < > 34*  GLUCOSE 118*  < > 195*  BUN 32*  < > 49*  CREATININE 1.20  < > 1.02  CALCIUM 9.2  < > 9.1  AST 17  --   --   ALT 16*  --   --   ALKPHOS 82  --   --   BILITOT 0.5  --   --   < > = values in this interval not  displayed.  Cardiac Enzymes  Recent Labs Lab 11/29/15 1850  TROPONINI <0.03    Microbiology Results  Results for orders placed or performed during the hospital encounter of 11/29/15  Blood culture (routine x 2)     Status: None (Preliminary result)   Collection Time: 11/29/15  6:55 PM  Result Value Ref Range Status   Specimen Description BLOOD LEFT ASSIST CONTROL  Final   Special Requests   Final    BOTTLES DRAWN AEROBIC AND ANAEROBIC 8CC AERO 6CC ANA   Culture NO GROWTH 3 DAYS  Final   Report Status PENDING  Incomplete  Blood culture (routine x 2)     Status: None (Preliminary result)   Collection Time: 11/29/15  7:05 PM  Result Value Ref Range Status   Specimen Description BLOOD RIGHT ASSIST CONTROL  Final   Special Requests BOTTLES DRAWN AEROBIC AND ANAEROBIC [place]  Final   Culture NO GROWTH 3 DAYS  Final   Report Status PENDING  Incomplete    RADIOLOGY:  Dg Chest 2 View  12/01/2015  CLINICAL DATA:  82 year old male admitted for respiratory distress. Initial encounter. EXAM: CHEST  2 VIEW COMPARISON:  11/29/2015 and earlier. FINDINGS: Seated AP and lateral views of the chest. Stable cardiomegaly and mediastinal contours. Chronic mild left lung base scarring or atelectasis, but increased left lower lobe opacity compared to 2016. No superimposed pneumothorax, pulmonary edema, or pleural effusion. No acute osseous abnormality identified. IMPRESSION: Confluent left lower lobe opacity suspicious for pneumonia and/or aspiration in this setting. No associated pleural effusion. Electronically Signed   By: Genevie Ann M.D.   On: 12/01/2015 14:48    EKG:   Orders placed or performed during the hospital encounter of 11/29/15  . ED EKG  . ED EKG  . EKG 12-Lead  . EKG 12-Lead      Management plans discussed with the patient, family and they are in agreement.  CODE STATUS:     Code Status Orders        Start     Ordered   11/29/15 2312  Do not attempt resuscitation (DNR)  Continuous    Question Answer Comment  In the event of cardiac or respiratory ARREST Do not call a "code blue"   In the event of cardiac or respiratory ARREST Do not perform Intubation, CPR, defibrillation or ACLS   In the event of cardiac or respiratory ARREST Use medication by any route, position, wound care, and other measures to relive pain and suffering. May use oxygen, suction and manual treatment of airway obstruction as needed for comfort.      11/29/15 2311    Code Status History    Date Active Date Inactive Code Status Order ID Comments User Context   10/08/2015 10:35 PM 10/12/2015  6:45 PM DNR [id number]  Vaughan Basta, MD Inpatient      TOTAL TIME TAKING CARE OF THIS PATIENT: 40 minutes.    Gladstone Lighter M.D on 12/02/2015 at 1:09 PM  Between 7am to 6pm - Pager - [phone]  After 6pm go to [email or web address] - password [email or web address] [place] [facility]  Office  [phone]  CC: Primary care physician; Tracie Harrier, MD

## 2015-12-04 LAB — CULTURE, BLOOD (ROUTINE X 2)
CULTURE: NO GROWTH
Culture: NO GROWTH

## 2015-12-05 ENCOUNTER — Other Ambulatory Visit
Admission: RE | Admit: 2015-12-05 | Discharge: 2015-12-05 | Disposition: A | Discharge status: Home / Self Care | Payer: No Typology Code available for payment source | Source: Ambulatory Visit | Attending: Family Medicine | Admitting: Family Medicine

## 2015-12-05 DIAGNOSIS — R112 Nausea with vomiting, unspecified: Secondary | ICD-10-CM | POA: Diagnosis present

## 2015-12-05 DIAGNOSIS — Z79899 Other long term (current) drug therapy: Secondary | ICD-10-CM | POA: Diagnosis not present

## 2015-12-05 LAB — CBC WITH DIFFERENTIAL/PLATELET
BASOS ABS: 0 10*3/uL (ref 0–0.1)
BASOS PCT: 0 %
Eosinophils Absolute: 0.1 10*3/uL (ref 0–0.7)
Eosinophils Relative: 1 %
HEMATOCRIT: 42.4 % (ref 40.0–52.0)
HEMOGLOBIN: 13.9 g/dL (ref 13.0–18.0)
Lymphocytes Relative: 15 %
Lymphs Abs: 1.8 10*3/uL (ref 1.0–3.6)
MCH: 30.4 pg (ref 26.0–34.0)
MCHC: 32.8 g/dL (ref 32.0–36.0)
MCV: 92.7 fL (ref 80.0–100.0)
MONO ABS: 1.1 10*3/uL — AB (ref 0.2–1.0)
Monocytes Relative: 10 %
NEUTROS ABS: 8.8 10*3/uL — AB (ref 1.4–6.5)
NEUTROS PCT: 74 %
Platelets: 162 10*3/uL (ref 150–440)
RBC: 4.57 MIL/uL (ref 4.40–5.90)
RDW: 14.6 % — AB (ref 11.5–14.5)
WBC: 11.8 10*3/uL — ABNORMAL HIGH (ref 3.8–10.6)

## 2015-12-05 LAB — COMPREHENSIVE METABOLIC PANEL
ALBUMIN: 3.3 g/dL — AB (ref 3.5–5.0)
ALT: 25 U/L (ref 17–63)
AST: 17 U/L (ref 15–41)
Alkaline Phosphatase: 64 U/L (ref 38–126)
Anion gap: 9 (ref 5–15)
BILIRUBIN TOTAL: 1.2 mg/dL (ref 0.3–1.2)
BUN: 27 mg/dL — AB (ref 6–20)
CO2: 30 mmol/L (ref 22–32)
Calcium: 8.8 mg/dL — ABNORMAL LOW (ref 8.9–10.3)
Chloride: 104 mmol/L (ref 101–111)
Creatinine, Ser: 0.81 mg/dL (ref 0.61–1.24)
GFR calc Af Amer: >60 mL/min (ref >60)
GFR calc non Af Amer: >60 mL/min (ref >60)
GLUCOSE: 162 mg/dL — AB (ref 65–99)
POTASSIUM: 3.6 mmol/L (ref 3.5–5.1)
Sodium: 143 mmol/L (ref 135–145)
TOTAL PROTEIN: 5.6 g/dL — AB (ref 6.5–8.1)

## 2015-12-05 LAB — URINALYSIS COMPLETE WITH MICROSCOPIC (ARMC ONLY)
BACTERIA UA: NONE SEEN
Bilirubin Urine: NEGATIVE
Glucose, UA: NEGATIVE mg/dL
LEUKOCYTES UA: NEGATIVE
Nitrite: NEGATIVE
PH: 6 (ref 5.0–8.0)
PROTEIN: NEGATIVE mg/dL
SQUAMOUS EPITHELIAL / LPF: NONE SEEN
Specific Gravity, Urine: 1.017 (ref 1.005–1.030)

## 2015-12-07 LAB — URINE CULTURE: Culture: NO GROWTH

## 2015-12-12 ENCOUNTER — Other Ambulatory Visit: Payer: Self-pay

## 2015-12-12 ENCOUNTER — Emergency Department: Payer: Medicare Other

## 2015-12-12 ENCOUNTER — Inpatient Hospital Stay
Admission: EM | Admit: 2015-12-12 | Discharge: 2015-12-20 | DRG: 871 | Disposition: A | Discharge status: Skilled Nursing Facility | Payer: Medicare Other | Attending: Internal Medicine | Admitting: Internal Medicine

## 2015-12-12 ENCOUNTER — Inpatient Hospital Stay: Payer: Medicare Other

## 2015-12-12 DIAGNOSIS — J440 Chronic obstructive pulmonary disease with acute lower respiratory infection: Secondary | ICD-10-CM | POA: Diagnosis present

## 2015-12-12 DIAGNOSIS — F0390 Unspecified dementia without behavioral disturbance: Secondary | ICD-10-CM | POA: Diagnosis present

## 2015-12-12 DIAGNOSIS — M109 Gout, unspecified: Secondary | ICD-10-CM | POA: Diagnosis present

## 2015-12-12 DIAGNOSIS — Z87442 Personal history of urinary calculi: Secondary | ICD-10-CM | POA: Diagnosis not present

## 2015-12-12 DIAGNOSIS — J441 Chronic obstructive pulmonary disease with (acute) exacerbation: Secondary | ICD-10-CM | POA: Diagnosis present

## 2015-12-12 DIAGNOSIS — J181 Lobar pneumonia, unspecified organism: Secondary | ICD-10-CM | POA: Diagnosis present

## 2015-12-12 DIAGNOSIS — Z7982 Long term (current) use of aspirin: Secondary | ICD-10-CM | POA: Diagnosis not present

## 2015-12-12 DIAGNOSIS — E785 Hyperlipidemia, unspecified: Secondary | ICD-10-CM | POA: Diagnosis present

## 2015-12-12 DIAGNOSIS — Z9889 Other specified postprocedural states: Secondary | ICD-10-CM | POA: Diagnosis not present

## 2015-12-12 DIAGNOSIS — Z8249 Family history of ischemic heart disease and other diseases of the circulatory system: Secondary | ICD-10-CM | POA: Diagnosis not present

## 2015-12-12 DIAGNOSIS — A419 Sepsis, unspecified organism: Secondary | ICD-10-CM | POA: Diagnosis present

## 2015-12-12 DIAGNOSIS — Z79899 Other long term (current) drug therapy: Secondary | ICD-10-CM

## 2015-12-12 DIAGNOSIS — Z9981 Dependence on supplemental oxygen: Secondary | ICD-10-CM | POA: Diagnosis not present

## 2015-12-12 DIAGNOSIS — Z86718 Personal history of other venous thrombosis and embolism: Secondary | ICD-10-CM

## 2015-12-12 DIAGNOSIS — Z66 Do not resuscitate: Secondary | ICD-10-CM | POA: Diagnosis present

## 2015-12-12 DIAGNOSIS — Z87891 Personal history of nicotine dependence: Secondary | ICD-10-CM

## 2015-12-12 DIAGNOSIS — R0602 Shortness of breath: Secondary | ICD-10-CM

## 2015-12-12 DIAGNOSIS — Z8546 Personal history of malignant neoplasm of prostate: Secondary | ICD-10-CM

## 2015-12-12 DIAGNOSIS — T380X5A Adverse effect of glucocorticoids and synthetic analogues, initial encounter: Secondary | ICD-10-CM | POA: Diagnosis present

## 2015-12-12 DIAGNOSIS — N179 Acute kidney failure, unspecified: Secondary | ICD-10-CM | POA: Diagnosis present

## 2015-12-12 DIAGNOSIS — I10 Essential (primary) hypertension: Secondary | ICD-10-CM | POA: Diagnosis present

## 2015-12-12 DIAGNOSIS — I482 Chronic atrial fibrillation: Secondary | ICD-10-CM | POA: Diagnosis present

## 2015-12-12 DIAGNOSIS — G9341 Metabolic encephalopathy: Secondary | ICD-10-CM | POA: Diagnosis present

## 2015-12-12 DIAGNOSIS — J9621 Acute and chronic respiratory failure with hypoxia: Secondary | ICD-10-CM | POA: Diagnosis present

## 2015-12-12 DIAGNOSIS — E041 Nontoxic single thyroid nodule: Secondary | ICD-10-CM

## 2015-12-12 DIAGNOSIS — Z8701 Personal history of pneumonia (recurrent): Secondary | ICD-10-CM | POA: Diagnosis not present

## 2015-12-12 DIAGNOSIS — I959 Hypotension, unspecified: Secondary | ICD-10-CM | POA: Diagnosis present

## 2015-12-12 DIAGNOSIS — I509 Heart failure, unspecified: Secondary | ICD-10-CM | POA: Diagnosis present

## 2015-12-12 LAB — URINALYSIS COMPLETE WITH MICROSCOPIC (ARMC ONLY)
Bilirubin Urine: NEGATIVE
Glucose, UA: NEGATIVE mg/dL
Ketones, ur: NEGATIVE mg/dL
LEUKOCYTES UA: NEGATIVE
Nitrite: NEGATIVE
PH: 5 (ref 5.0–8.0)
PROTEIN: NEGATIVE mg/dL
SQUAMOUS EPITHELIAL / LPF: NONE SEEN
Specific Gravity, Urine: 1.011 (ref 1.005–1.030)
WBC, UA: NONE SEEN WBC/hpf (ref 0–5)

## 2015-12-12 LAB — COMPREHENSIVE METABOLIC PANEL
ALK PHOS: 68 U/L (ref 38–126)
ALT: 11 U/L — AB (ref 17–63)
AST: 19 U/L (ref 15–41)
Albumin: 3.2 g/dL — ABNORMAL LOW (ref 3.5–5.0)
Anion gap: 9 (ref 5–15)
BILIRUBIN TOTAL: 0.7 mg/dL (ref 0.3–1.2)
BUN: 28 mg/dL — ABNORMAL HIGH (ref 6–20)
CALCIUM: 8.3 mg/dL — AB (ref 8.9–10.3)
CO2: 28 mmol/L (ref 22–32)
CREATININE: 2.07 mg/dL — AB (ref 0.61–1.24)
Chloride: 102 mmol/L (ref 101–111)
GFR calc non Af Amer: 27 mL/min — ABNORMAL LOW (ref >60)
GFR, EST AFRICAN AMERICAN: 31 mL/min — AB (ref >60)
Glucose, Bld: 177 mg/dL — ABNORMAL HIGH (ref 65–99)
Potassium: 4.5 mmol/L (ref 3.5–5.1)
SODIUM: 139 mmol/L (ref 135–145)
TOTAL PROTEIN: 6.3 g/dL — AB (ref 6.5–8.1)

## 2015-12-12 LAB — LIPASE, BLOOD: Lipase: 21 U/L (ref 11–51)

## 2015-12-12 LAB — PROTIME-INR
INR: 1.1
Prothrombin Time: 14.4 seconds (ref 11.4–15.0)

## 2015-12-12 LAB — CBC WITH DIFFERENTIAL/PLATELET
BASOS ABS: 0.1 10*3/uL (ref 0–0.1)
BASOS PCT: 1 %
EOS ABS: 0.5 10*3/uL (ref 0–0.7)
Eosinophils Relative: 3 %
HCT: 38.3 % — ABNORMAL LOW (ref 40.0–52.0)
HEMOGLOBIN: 12.4 g/dL — AB (ref 13.0–18.0)
Lymphocytes Relative: 20 %
Lymphs Abs: 2.8 10*3/uL (ref 1.0–3.6)
MCH: 30 pg (ref 26.0–34.0)
MCHC: 32.3 g/dL (ref 32.0–36.0)
MCV: 92.7 fL (ref 80.0–100.0)
MONO ABS: 0.9 10*3/uL (ref 0.2–1.0)
MONOS PCT: 6 %
NEUTROS PCT: 70 %
Neutro Abs: 9.7 10*3/uL — ABNORMAL HIGH (ref 1.4–6.5)
Platelets: 212 10*3/uL (ref 150–440)
RBC: 4.13 MIL/uL — ABNORMAL LOW (ref 4.40–5.90)
RDW: 14.9 % — AB (ref 11.5–14.5)
WBC: 14 10*3/uL — ABNORMAL HIGH (ref 3.8–10.6)

## 2015-12-12 LAB — LACTIC ACID, PLASMA
LACTIC ACID, VENOUS: 2.9 mmol/L — AB (ref 0.5–2.0)
LACTIC ACID, VENOUS: 2.2 mmol/L — AB (ref 0.5–2.0)

## 2015-12-12 LAB — TROPONIN I: Troponin I: <0.03 ng/mL (ref <0.031)

## 2015-12-12 LAB — MRSA PCR SCREENING: MRSA by PCR: NEGATIVE

## 2015-12-12 MED ORDER — SODIUM CHLORIDE 0.9 % IV BOLUS (SEPSIS)
1000.0000 mL | INTRAVENOUS | Status: AC
  Administered 2015-12-12 (×3): 1000 mL via INTRAVENOUS

## 2015-12-12 MED ORDER — PIPERACILLIN-TAZOBACTAM 3.375 G IVPB: 3.3750 g | Freq: Three times a day (TID) | INTRAVENOUS | Status: DC

## 2015-12-12 MED ORDER — SODIUM CHLORIDE 0.9 % IV BOLUS (SEPSIS): 500.0000 mL | INTRAVENOUS | Status: DC

## 2015-12-12 MED ORDER — VANCOMYCIN HCL IN DEXTROSE 1-5 GM/200ML-% IV SOLN
1000.0000 mg | Freq: Once | INTRAVENOUS | Status: AC
  Administered 2015-12-12: 1000 mg via INTRAVENOUS
  Filled 2015-12-12: qty 200

## 2015-12-12 MED ORDER — HYDROCODONE-ACETAMINOPHEN 5-325 MG PO TABS
1.0000 | ORAL_TABLET | ORAL | Status: DC | PRN
  Administered 2015-12-17: 2 via ORAL
  Filled 2015-12-12 (×2): qty 2

## 2015-12-12 MED ORDER — VANCOMYCIN HCL IN DEXTROSE 1-5 GM/200ML-% IV SOLN
1000.0000 mg | INTRAVENOUS | Status: DC
  Administered 2015-12-12 – 2015-12-13 (×2): 1000 mg via INTRAVENOUS
  Filled 2015-12-12 (×2): qty 200

## 2015-12-12 MED ORDER — CETYLPYRIDINIUM CHLORIDE 0.05 % MT LIQD
7.0000 mL | Freq: Two times a day (BID) | OROMUCOSAL | Status: DC
  Administered 2015-12-12 – 2015-12-20 (×17): 7 mL via OROMUCOSAL

## 2015-12-12 MED ORDER — NOREPINEPHRINE BITARTRATE 1 MG/ML IV SOLN
0.0000 ug/min | INTRAVENOUS | Status: DC
  Filled 2015-12-12: qty 4

## 2015-12-12 MED ORDER — PIPERACILLIN-TAZOBACTAM 3.375 G IVPB
3.3750 g | Freq: Three times a day (TID) | INTRAVENOUS | Status: DC
  Administered 2015-12-12 – 2015-12-14 (×5): 3.375 g via INTRAVENOUS
  Filled 2015-12-12 (×8): qty 50

## 2015-12-12 MED ORDER — SODIUM CHLORIDE 0.9% FLUSH
3.0000 mL | Freq: Two times a day (BID) | INTRAVENOUS | Status: DC
  Administered 2015-12-12 – 2015-12-20 (×17): 3 mL via INTRAVENOUS

## 2015-12-12 MED ORDER — SODIUM CHLORIDE 0.9 % IV SOLN
INTRAVENOUS | Status: DC
  Administered 2015-12-12 – 2015-12-14 (×5): via INTRAVENOUS

## 2015-12-12 MED ORDER — ONDANSETRON HCL 4 MG/2ML IJ SOLN: 4.0000 mg | Freq: Four times a day (QID) | INTRAMUSCULAR | Status: DC | PRN

## 2015-12-12 MED ORDER — DEXTROSE 5 % IV SOLN
2.0000 g | Freq: Once | INTRAVENOUS | Status: AC
  Administered 2015-12-12: 2 g via INTRAVENOUS
  Filled 2015-12-12: qty 2

## 2015-12-12 MED ORDER — MORPHINE SULFATE (PF) 2 MG/ML IV SOLN
1.0000 mg | INTRAVENOUS | Status: DC | PRN
  Administered 2015-12-12 – 2015-12-15 (×7): 1 mg via INTRAVENOUS
  Filled 2015-12-12 (×7): qty 1

## 2015-12-12 MED ORDER — DEXTROSE 5 % IV SOLN
2.0000 ug/min | Freq: Once | INTRAVENOUS | Status: AC
  Administered 2015-12-12: 2 ug/min via INTRAVENOUS
  Filled 2015-12-12: qty 4

## 2015-12-12 MED ORDER — ONDANSETRON HCL 4 MG PO TABS: 4.0000 mg | ORAL_TABLET | Freq: Four times a day (QID) | ORAL | Status: DC | PRN

## 2015-12-12 MED ORDER — HEPARIN SODIUM (PORCINE) 5000 UNIT/ML IJ SOLN
5000.0000 [IU] | Freq: Three times a day (TID) | INTRAMUSCULAR | Status: DC
  Administered 2015-12-12 – 2015-12-13 (×4): 5000 [IU] via SUBCUTANEOUS
  Filled 2015-12-12 (×4): qty 1

## 2015-12-12 NOTE — ED Notes (Signed)
Pt came via EMS from [facility]. Pt has been at [facility] since Thursday for pneumonia. Per staff, pt was found sitting in the recliner unresponsive. Pt satting 86% on room air. Pt normally on 3 L [place]. Blood pressure 60/30 per EMS.

## 2015-12-12 NOTE — Progress Notes (Signed)
ANTIBIOTIC CONSULT NOTE - INITIAL  Pharmacy Consult for Vancomycin   Indication: rule out sepsis  No Known Allergies  Patient Measurements: Height: 5\' 9"  (175.3 cm) Weight: 249 lb (112.946 kg) IBW/kg (Calculated) : 70.7  Vital Signs: Temp: 96.5 F (35.8 C) (01/29 0850) Temp Source: Rectal (01/29 0721) BP: 86/47 mmHg (01/29 0850) Pulse Rate: 58 (01/29 0850)   Recent Labs  12/12/15 0716  WBC 14.0*  HGB 12.4*  PLT 212  CREATININE 2.07*   Estimated Creatinine Clearance: 29.4 mL/min (by C-G formula based on Cr of 2.07). No results for input(s): VANCOTROUGH, VANCOPEAK, VANCORANDOM, GENTTROUGH, GENTPEAK, GENTRANDOM, TOBRATROUGH, TOBRAPEAK, TOBRARND, AMIKACINPEAK, AMIKACINTROU, AMIKACIN in the last 72 hours.   Microbiology: Recent Results (from the past 720 hour(s))  Blood culture (routine x 2)     Status: None   Collection Time: 11/29/15  6:55 PM  Result Value Ref Range Status   Specimen Description BLOOD LEFT ASSIST CONTROL  Final   Special Requests   Final    BOTTLES DRAWN AEROBIC AND ANAEROBIC 8CC AERO [place] ANA   Culture NO GROWTH 5 DAYS  Final   Report Status 12/04/2015 FINAL  Final  Blood culture (routine x 2)     Status: None   Collection Time: 11/29/15  7:05 PM  Result Value Ref Range Status   Specimen Description BLOOD RIGHT ASSIST CONTROL  Final   Special Requests BOTTLES DRAWN AEROBIC AND ANAEROBIC [place]  Final   Culture NO GROWTH 5 DAYS  Final   Report Status 12/04/2015 FINAL  Final  Urine culture     Status: None   Collection Time: 12/05/15  4:20 PM  Result Value Ref Range Status   Specimen Description URINE, RANDOM  Final   Special Requests NONE  Final   Culture NO GROWTH 2 DAYS  Final   Report Status 12/07/2015 FINAL  Final    Medical History: Past Medical History  Diagnosis Date  . Hypertension   . Hyperlipidemia   . DJD (degenerative joint disease)   . History of gout   . History of kidney stones   . History of prostate cancer   . Renal disorder      kidney stones  . Cancer ([place])   . CHF (congestive heart failure) ([place])   . COPD (chronic obstructive pulmonary disease) ([place])   . DVT (deep venous thrombosis) ([place]) sept 2016    Assessment: 82 yo male brought ED via EMS after being found unresponsive at nursing home.  Pharmacy consulted for dosing and monitoring of vancomycin for suspected sepsis. Vancomycin 1gm IV x1 was given in ED.  Patient also received one dose of cefepime 2gm in ED and has now been initiated on Zosyn 3.375 q8h IV EI for possible aspiration PNA.    WBC: 14    Lactic acid: 2.9     Temp: 96.5 Scr: 2.07    CrCl: 76ml/min  Goal of Therapy:  Vancomycin trough level 15-20 mcg/ml  Plan:  Ke: 0.028    T1/2: 25h     VD: 61    Will start patient on Vancomycin 1g IV every 24 hours. Estimated trough as Css 16.7. Will do 6 hour stack dosing and order trough prior to 4th regimen dose.  Scr ordered with am labs. Pharmacy will continue to follow labs and renal function and adjust as needed.   Nancy Fetter, PharmD Pharmacy Resident 12/12/2015,9:39 AM

## 2015-12-12 NOTE — H&P (Signed)
[facility] NAME: Kirk Hubbard    MR#:  [id number]  DATE OF BIRTH:  10/08/1923  DATE OF ADMISSION:  12/12/2015  PRIMARY CARE PHYSICIAN: Tracie Harrier, MD   REQUESTING/REFERRING PHYSICIAN: Harvest Dark MD  CHIEF COMPLAINT:   Chief Complaint  Patient presents with  . Shortness of Breath    HISTORY OF PRESENT ILLNESS: Braydin Aloi  is a 82 y.o. male with a known history of  hypertension, hyperlipidemia, mild dementia, history of gout, congestive heart failure, COPD who was actually hospitalized here with community-acquired pneumonia and was discharged to a rehabilitation facility. He is brought back due to unresponsiveness. Patient apparently was awake and alert 1 one hour prior per the nursing home staff. They found him unresponsive and called EMS upon EMS arrival patient was moaning to sternal rub but would not follow commands. Patient's blood pressure was noted to be 60/30. Patient also was hypothermic in the ER. His WBC count was elevated. He is also noticed to have elevated lactic acid and acute respiratory failure.  PAST MEDICAL HISTORY:   Past Medical History  Diagnosis Date  . Hypertension   . Hyperlipidemia   . DJD (degenerative joint disease)   . History of gout   . History of kidney stones   . History of prostate cancer   . Renal disorder     kidney stones  . Cancer ([place])   . CHF (congestive heart failure) ([place])   . COPD (chronic obstructive pulmonary disease) ([place])   . DVT (deep venous thrombosis) ([place]) sept 2016    PAST SURGICAL HISTORY: Past Surgical History  Procedure Laterality Date  . Kidney stone surgery      SOCIAL HISTORY:  Social History  Substance Use Topics  . Smoking status: Former Smoker    Types: Cigarettes    Quit date: 11/13/1988  . Smokeless tobacco: Not on file  . Alcohol Use: No    FAMILY HISTORY:  Family History  Problem Relation Age of Onset  . Congestive Heart Failure  Brother     DRUG ALLERGIES: No Known Allergies  REVIEW OF SYSTEMS:   CONSTITUTIONAL: Unable to provide due to his mental status    MEDICATIONS AT HOME:  Prior to Admission medications   Medication Sig Start Date End Date Taking? Authorizing Provider  allopurinol (ZYLOPRIM) 300 MG tablet Take 300 mg by mouth daily.   Yes Historical Provider, MD  apixaban (ELIQUIS) 5 MG TABS tablet Take 5 mg by mouth 2 (two) times daily.   Yes Historical Provider, MD  aspirin EC 81 MG tablet Take 81 mg by mouth daily.    Yes Historical Provider, MD  budesonide-formoterol (SYMBICORT) 160-4.5 MCG/ACT inhaler Inhale 2 puffs into the lungs 2 (two) times daily.   Yes Historical Provider, MD  furosemide (LASIX) 40 MG tablet Take 40 mg by mouth daily.   Yes Historical Provider, MD  guaiFENesin (MUCINEX) 600 MG 12 hr tablet Take 1 tablet (600 mg total) by mouth 2 (two) times daily. Patient taking differently: Take 600 mg by mouth 2 (two) times daily. Take for 2 weeks 12/02/15  Yes Gladstone Lighter, MD  guaiFENesin (MUCINEX) 600 MG 12 hr tablet Take 600 mg by mouth 2 (two) times daily as needed for cough. 12/17/15  Yes Historical Provider, MD  ipratropium-albuterol (DUONEB) 0.5-2.5 (3) MG/3ML SOLN Take 3 mLs by nebulization 4 (four) times daily. 10/12/15  Yes Epifanio Lesches, MD  lisinopril (PRINIVIL,ZESTRIL) 20 MG tablet Take 20 mg  by mouth daily.   Yes Historical Provider, MD  lovastatin (MEVACOR) 20 MG tablet Take 20 mg by mouth at bedtime.    Yes Historical Provider, MD  metoprolol (LOPRESSOR) 50 MG tablet Take 50 mg by mouth 2 (two) times daily.   Yes Historical Provider, MD  nystatin (MYCOSTATIN/NYSTOP) 100000 UNIT/GM POWD Apply twice a day- in groin and under abdominal folds 12/02/15  Yes Gladstone Lighter, MD  omeprazole (PRILOSEC) 20 MG capsule Take 20 mg by mouth daily.   Yes Historical Provider, MD  Respiratory Therapy Supplies (SPIROMETER) KIT 1 each by Does not apply route 4 (four) times daily. Use  while awake   Yes Historical Provider, MD  senna-docusate (SENNA-S) 8.6-50 MG tablet Take 2 tablets by mouth at bedtime. Hold for loose stools *note dose*   Yes Historical Provider, MD  Umeclidinium Bromide (INCRUSE ELLIPTA) 62.5 MCG/INH AEPB Inhale 1 puff into the lungs daily. *Wait 5 minutes between inhalers*   Yes Historical Provider, MD  amoxicillin-clavulanate (AUGMENTIN) 875-125 MG tablet Take 1 tablet by mouth every 12 (twelve) hours. X 6 more days 12/02/15   Gladstone Lighter, MD  predniSONE (STERAPRED UNI-PAK 21 TAB) 10 MG (21) TBPK tablet Take 1 tablet (10 mg total) by mouth daily. 6 tabs PO x 1 day 5 tabs PO x 1 day 4 tabs PO x 1 day 3 tabs PO x 1 day 2 tabs PO x 1 day 1 tab PO x 1 day and stop 12/02/15   Gladstone Lighter, MD  senna (SENOKOT) 8.6 MG TABS tablet Take 1 tablet (8.6 mg total) by mouth daily. Patient not taking: Reported on 11/29/2015 10/12/15   Epifanio Lesches, MD  tiotropium (SPIRIVA) 18 MCG inhalation capsule Place 1 capsule (18 mcg total) into inhaler and inhale daily. 12/02/15   Gladstone Lighter, MD      PHYSICAL EXAMINATION:   VITAL SIGNS: Blood pressure 86/47, pulse 58, temperature 96.5 F (35.8 C), temperature source Rectal, resp. rate 24, height '5\' 9"'$  (1.753 m), weight 112.946 kg (249 lb), SpO2 100 %.  GENERAL:  82 y.o.-year-old patient lying in the bed critically ill on fullface mask, opens eyes but does not respond EYES: Pupils equal, round, reactive to light and accommodation. No scleral icterus. HEENT: Head atraumatic, normocephalic. Oropharynx and nasopharynx clear.  NECK:  Supple, no jugular venous distention. No thyroid enlargement, no tenderness.  LUNGS: Diminished breath sounds with rhonchi bilaterally no wheezing Accesory muscle usage CARDIOVASCULAR: S1, S2 normal. No murmurs, rubs, or gallops.  ABDOMEN: Soft, nontender, nondistended. Bowel sounds present. No organomegaly or mass.  EXTREMITIES: No pedal edema, cyanosis, or clubbing.   NEUROLOGIC: He is not responding to command PSYCHIATRIC: Not following commands not anxious SKIN: No obvious rash, lesion, or ulcer.   LABORATORY PANEL:   CBC  Recent Labs Lab 12/05/15 1620 12/12/15 0716  WBC 11.8* 14.0*  HGB 13.9 12.4*  HCT 42.4 38.3*  PLT 162 212  MCV 92.7 92.7  MCH 30.4 30.0  MCHC 32.8 32.3  RDW 14.6* 14.9*  LYMPHSABS 1.8 2.8  MONOABS 1.1* 0.9  EOSABS 0.1 0.5  BASOSABS 0.0 0.1   ------------------------------------------------------------------------------------------------------------------  Chemistries   Recent Labs Lab 12/05/15 1620 12/12/15 0716  NA 143 139  K 3.6 4.5  CL 104 102  CO2 30 28  GLUCOSE 162* 177*  BUN 27* 28*  CREATININE 0.81 2.07*  CALCIUM 8.8* 8.3*  AST 17 19  ALT 25 11*  ALKPHOS 64 68  BILITOT 1.2 0.7   ------------------------------------------------------------------------------------------------------------------ estimated creatinine clearance is 29.4  mL/min (by C-G formula based on Cr of 2.07). ------------------------------------------------------------------------------------------------------------------ No results for input(s): TSH, T4TOTAL, T3FREE, THYROIDAB in the last 72 hours.  Invalid input(s): FREET3   Coagulation profile  Recent Labs Lab 12/12/15 0716  INR 1.10   ------------------------------------------------------------------------------------------------------------------- No results for input(s): DDIMER in the last 72 hours. -------------------------------------------------------------------------------------------------------------------  Cardiac Enzymes  Recent Labs Lab 12/12/15 0716  TROPONINI <0.03   ------------------------------------------------------------------------------------------------------------------ Invalid input(s): POCBNP  ---------------------------------------------------------------------------------------------------------------  Urinalysis    Component  Value Date/Time   COLORURINE YELLOW* 12/12/2015 0739   APPEARANCEUR CLEAR* 12/12/2015 0739   LABSPEC 1.011 12/12/2015 0739   PHURINE 5.0 12/12/2015 0739   GLUCOSEU NEGATIVE 12/12/2015 0739   HGBUR 1+* 12/12/2015 0739   BILIRUBINUR NEGATIVE 12/12/2015 0739   KETONESUR NEGATIVE 12/12/2015 0739   PROTEINUR NEGATIVE 12/12/2015 0739   NITRITE NEGATIVE 12/12/2015 0739   LEUKOCYTESUR NEGATIVE 12/12/2015 0739     RADIOLOGY: Ct Head Wo Contrast  12/12/2015  CLINICAL DATA:  Altered mental status, unresponsive EXAM: CT HEAD WITHOUT CONTRAST TECHNIQUE: Contiguous axial images were obtained from the base of the skull through the vertex without contrast. COMPARISON:  11/29/2015 FINDINGS: Similar pattern of diffuse brain atrophy. Mild chronic white matter microvascular ischemic changes about the ventricles. No acute intracranial hemorrhage, mass lesion, definite infarction, mass effect, focal edema, midline shift, herniation, hydrocephalus, or extra-axial fluid collection. Cisterns are patent. Cerebellar atrophy as well. Orbits are symmetric. Mastoids and sinuses remain clear. IMPRESSION: Stable atrophy and chronic white matter microvascular ischemic changes. No interval change or acute process by noncontrast CT. Electronically Signed   By: Jerilynn Mages.  Shick M.D.   On: 12/12/2015 08:11   Dg Chest Port 1 View  12/12/2015  CLINICAL DATA:  Increasing respiratory distress EXAM: PORTABLE CHEST 1 VIEW COMPARISON:  12/01/2015 FINDINGS: Cardiac shadow remains enlarged. The overall inspiratory effort is decreased from the prior exam. Stable left basilar atelectatic changes are noted. No focal confluent infiltrate is seen. No bony abnormality is noted. IMPRESSION: Stable left basilar atelectasis. Electronically Signed   By: Inez Catalina M.D.   On: 12/12/2015 07:53    EKG: Orders placed or performed during the hospital encounter of 12/12/15  . ED EKG 12-Lead  . ED EKG 12-Lead    IMPRESSION AND PLAN: Patient is a  82 year old white male recently hospitalized with pneumonia now being admitted with decrease in responsiveness  1. Acute encephalopathy due to sepsis: We'll treat with broad-spectrum antibiotics once he is more stable I will try to obtain a CT of the chest to see if he has worsening infiltrates chest x-ray appears stable there is also concerned that he may be aspirating as well. She is stable we'll obtain a speech eval.  2. Sepsis likely related to pulmonary source. At this time will treat him with broad-spectrum anabiotic's, will do warming measures, supportive blood pressure he may need pressors  3. Acute respiratory failure and likely related to him recurrent pneumonia. At this time will continue full facemask oxygen patient is a DO NOT RESUSCITATE so will not be intubated  4. Hypotension due to sepsis may need pressors  5. COPD no evidence of acute exasperation we'll do when necessary nebs  6. CODE STATUS DO NOT RESUSCITATE confirmed also discussed with the son he would like pressors but if no significant improvement then we will provide him option of comfort care.     All the records are reviewed and case discussed with ED provider. Management plans discussed with the patient, family and they are in agreement.  CODE  STATUS:    Code Status Orders        Start     Ordered   12/12/15 0906  Do not attempt resuscitation (DNR)   Continuous    Question Answer Comment  In the event of cardiac or respiratory ARREST Do not call a "code blue"   In the event of cardiac or respiratory ARREST Do not perform Intubation, CPR, defibrillation or ACLS   In the event of cardiac or respiratory ARREST Use medication by any route, position, wound care, and other measures to relive pain and suffering. May use oxygen, suction and manual treatment of airway obstruction as needed for comfort.      12/12/15 0906    Code Status History    Date Active Date Inactive Code Status Order ID Comments User  Context   11/29/2015 11:12 PM 12/02/2015  6:25 PM DNR [id number]  Vaughan Basta, MD Inpatient   10/08/2015 10:35 PM 10/12/2015  6:45 PM DNR [id number]  Vaughan Basta, MD Inpatient    Advance Directive Documentation        Most Recent Value   Type of Advance Directive  Out of facility DNR (pink MOST or yellow form)   Pre-existing out of facility DNR order (yellow form or pink MOST form)     "MOST" Form in Place?         TOTAL TIME TAKING CARE OF THIS PATIENT: 60 minutes. Critical care time    Dustin Flock M.D on 12/12/2015 at 9:12 AM  Between 7am to 6pm - Pager - [phone]  After 6pm go to [email or web address] - password [email or web address] [facility]  Office  [phone]  CC: Primary care physician; Tracie Harrier, MD

## 2015-12-12 NOTE — ED Provider Notes (Addendum)
[facility] Emergency Department Provider Note  Time seen: 7:28 AM  I have reviewed the triage vital signs and the nursing notes.   HISTORY  Chief Complaint Shortness of Breath    HPI Kirk Hubbard is a 82 y.o. male with a past medical history of hypertension, hyperlipidemia, mild dementia, CHF, COPD, who presents to the emergency department unresponsive by EMS. According to EMS the patient is coming from [facility], and per staff the patient was awake and alert one hour ago. Staff went and checked on the patient and found him to be sitting in his chair, largely unresponsive so they called EMS. EMS states upon arrival patient will moan to sternal rub, but would not answer questions or follow commands. Patient was discharged from the hospital 10 days ago with a pneumonia, finished a course of Augmentin. Per EMS in route to the hospital the patient's blood pressure was 60/30, other vitals are largely within normal limits. Upon arrival to the emergency department and the patient will moan to sternal rub, but does not follow any other commands, does not respond to verbal commands or questions. Keeps eyes closed.Patient's extremities are very stiff/contracted.    Past Medical History  Diagnosis Date  . Hypertension   . Hyperlipidemia   . DJD (degenerative joint disease)   . History of gout   . History of kidney stones   . History of prostate cancer   . Renal disorder     kidney stones  . Cancer ([place])   . CHF (congestive heart failure) ([place])   . COPD (chronic obstructive pulmonary disease) ([place])   . DVT (deep venous thrombosis) ([place]) sept 2016    Patient Active Problem List   Diagnosis Date Noted  . Pressure ulcer 11/30/2015  . Pneumonia 11/29/2015  . COPD exacerbation ([place]) 11/29/2015  . Cellulitis 10/08/2015  . DVT (deep venous thrombosis) ([place]) 10/08/2015  . Lung nodule seen on imaging study 09/02/2015    Past Surgical History  Procedure  Laterality Date  . Kidney stone surgery      Current Outpatient Rx  Name  Route  Sig  Dispense  Refill  . allopurinol (ZYLOPRIM) 300 MG tablet   Oral   Take 300 mg by mouth daily.         Marland Kitchen amoxicillin-clavulanate (AUGMENTIN) 875-125 MG tablet   Oral   Take 1 tablet by mouth every 12 (twelve) hours. X 6 more days   12 tablet   0   . apixaban (ELIQUIS) 5 MG TABS tablet   Oral   Take 1 tablet (5 mg total) by mouth 2 (two) times daily. Patient not taking: Reported on 11/29/2015   20 tablet   0   . aspirin EC 81 MG tablet   Oral   Take 81 mg by mouth 2 (two) times daily.          . budesonide-formoterol (SYMBICORT) 160-4.5 MCG/ACT inhaler   Inhalation   Inhale 2 puffs into the lungs 2 (two) times daily.         . furosemide (LASIX) 20 MG tablet   Oral   Take 40 mg by mouth daily.          Marland Kitchen guaiFENesin (MUCINEX) 600 MG 12 hr tablet   Oral   Take 1 tablet (600 mg total) by mouth 2 (two) times daily.   60 tablet   0   . ipratropium-albuterol (DUONEB) 0.5-2.5 (3) MG/3ML SOLN   Nebulization   Take  3 mLs by nebulization 4 (four) times daily. Patient taking differently: Take 3 mLs by nebulization 4 (four) times daily as needed (for wheezing/shortness of breath).    100 mL   0   . lisinopril (PRINIVIL,ZESTRIL) 20 MG tablet   Oral   Take 20 mg by mouth daily.         Marland Kitchen lovastatin (MEVACOR) 20 MG tablet   Oral   Take 20 mg by mouth at bedtime.          . metoprolol (LOPRESSOR) 50 MG tablet   Oral   Take 50 mg by mouth 2 (two) times daily.         Marland Kitchen nystatin (MYCOSTATIN/NYSTOP) 100000 UNIT/GM POWD      Apply twice a day- in groin and under abdominal folds   15 g   0   . predniSONE (STERAPRED UNI-PAK 21 TAB) 10 MG (21) TBPK tablet   Oral   Take 1 tablet (10 mg total) by mouth daily. 6 tabs PO x 1 day 5 tabs PO x 1 day 4 tabs PO x 1 day 3 tabs PO x 1 day 2 tabs PO x 1 day 1 tab PO x 1 day and stop   21 tablet   0   . senna (SENOKOT) 8.6 MG  TABS tablet   Oral   Take 1 tablet (8.6 mg total) by mouth daily. Patient not taking: Reported on 11/29/2015   120 each   0   . tiotropium (SPIRIVA) 18 MCG inhalation capsule   Inhalation   Place 1 capsule (18 mcg total) into inhaler and inhale daily.   30 capsule   12     Allergies Review of patient's allergies indicates no known allergies.  Family History  Problem Relation Age of Onset  . Congestive Heart Failure Brother     Social History Social History  Substance Use Topics  . Smoking status: Former Smoker    Types: Cigarettes    Quit date: 11/13/1988  . Smokeless tobacco: None  . Alcohol Use: No    Review of Systems Unable to obtain a review of systems due to unresponsiveness  ____________________________________________   PHYSICAL EXAM:  VITAL SIGNS: ED Triage Vitals  Enc Vitals Group     BP --      Pulse Rate 12/12/15 0721 75     Resp 12/12/15 0721 26     Temp --      Temp src --      SpO2 --      Weight --      Height --      Head Cir --      Peak Flow --      Pain Score --      Pain Loc --      Pain Edu? --      Excl. in [place]? --     Constitutional: Somnolent, minimal response to painful stimuli otherwise unresponsive. Eyes: Normal exam ENT   Head: Normocephalic and atraumatic.   Mouth/Throat: Mucous membranes are moist. Cardiovascular: Normal rate, regular rhythm. Respiratory: Patient is to Around 25 breaths per minute. No obvious wheezes rales or rhonchi. Decent air movement bilaterally. Gastrointestinal: Soft, mild distention. No reaction to palpation. Musculoskeletal: Very stiff extremities, difficult to move extremities. 1+ lower extremity edema equal bilaterally. Neurologic:  Patient moans to sternal rub, does not move spontaneously, difficult neurologic exam. Skin:  Skin is warm, dry and intact.  Psychiatric: Mostly unresponsive  ____________________________________________    EKG  EKG reviewed and interpreted by myself  appears to show a ventricular rate around 80 bpm. Widened QRS. Nonspecific ST changes. No ST elevations. A lot of electrical interference on EKG likely due to patient's muscle contractions.  ____________________________________________    RADIOLOGY  CT head shows no acute abnormality Chest x-ray shows atelectasis without consolidation or pneumonia.  ____________________________________________    INITIAL IMPRESSION / ASSESSMENT AND PLAN / ED COURSE  Pertinent labs & imaging results that were available during my care of the patient were reviewed by me and considered in my medical decision making (see chart for details).  Patient presents the emergency department largely unresponsive from his nursing facility. Per nursing home the patient was acting at his baseline one hour prior to arrival. In the emergency department the patient is somnolent, minimally responsive only to pain. Patient is very stiff, with contracted muscles. Hypoxic on room air. Patient normally wears 3 L nasal cannula. Currently on nonrebreather satting in the low 90s. Patient with mild hypothermia. Difficult to obtain a blood pressure due to muscle contractions, have tried multiple times with the machine as well as manual readings, patient has a decent radial pulse, we will continue to attempt to obtain an accurate blood pressure. Given the patient's recent discharge with a diagnosis of pneumonia, this raises the question of sepsis. I have ordered per sepsis protocol. I will also obtain a chest x-ray, CT head, begin empiric antibiotics while awaiting results. Patient has a DO NOT RESUSCITATE order. Per EMS family will be coming to the emergency department.   Patient's labs show acute renal failure. He has a leukocytosis of 14,000, with an elevated lactic acid 2.9. We will continue with IV hydration. Patient's blood pressure is now reporting at 75 systolic, we have attempted to recheck multiple times for similar results. This is  more consistent with the patient's presentation. We will hang fluids on a pressure bag to attempt to increased blood pressure, continue with antibiotics, patient's core temperature currently 96.6, we have the patient on a bear hugger. Patient will need to be admitted to the hospital for acute renal failure, possible sepsis, hypotension and altered mental status. Patient is somewhat more responsive, moaning and occasionally talking but still very somnolent and not close to his baseline per son.  Patient remained hypotensive even with fluids, currently 80 systolic. I placed a central line in the right internal jugular  I will start the patient on Levaquin and admit to the ICU.  CRITICAL CARE Performed by: Harvest Dark   Total critical care time: 45 minutes  Critical care time was exclusive of separately billable procedures and treating other patients.  Critical care was necessary to treat or prevent imminent or life-threatening deterioration.  Critical care was time spent personally by me on the following activities: development of treatment plan with patient and/or surrogate as well as nursing, discussions with consultants, evaluation of patient's response to treatment, examination of patient, obtaining history from patient or surrogate, ordering and performing treatments and interventions, ordering and review of laboratory studies, ordering and review of radiographic studies, pulse oximetry and re-evaluation of patient's condition.  CENTRAL LINE Performed by: Harvest Dark Consent: The procedure was performed in an emergent situation. Required items: required blood products, implants, devices, and special equipment available Patient identity confirmed: arm band and provided demographic data Time out: Immediately prior to procedure a "time out" was called to verify the correct patient, procedure, equipment, support staff and site/side marked as required. Indications: vascular  access Anesthesia: local  infiltration Local anesthetic: lidocaine 1% with epinephrine Anesthetic total: 3 ml Patient sedated: no Preparation: skin prepped with 2% chlorhexidine Skin prep agent dried: skin prep agent completely dried prior to procedure Sterile barriers: all five maximum sterile barriers used - cap, mask, sterile gown, sterile gloves, and large sterile sheet Hand hygiene: hand hygiene performed prior to central venous catheter insertion  Location details: Right internal jugular   Catheter type: triple lumen Catheter size: 8 Fr Pre-procedure: landmarks identified Ultrasound guidance: Yes  Successful placement: yes Post-procedure: line sutured and dressing applied Assessment: blood return through all parts, free fluid flow, placement verified by x-ray and no pneumothorax on x-ray Patient tolerance: Patient tolerated the procedure well with no immediate complications.   ____________________________________________   FINAL CLINICAL IMPRESSION(S) / ED DIAGNOSES  Unresponsive Hypoxic Hypotensive Sepsis Acute renal failure  Harvest Dark, MD 12/12/15 [id number]  Harvest Dark, MD 12/12/15 [phone]

## 2015-12-12 NOTE — ED Notes (Signed)
Lactic acid 2.9. MD notified.

## 2015-12-12 NOTE — Progress Notes (Signed)
Dr. Posey Pronto visited unit to check on patient that arrived from ED. MD notified of lactic acid value of 2.2 (down from 2.9) no new orders and patient's improved blood pressure at this time. MD also clarified that patient did not need anymore fluid boluses at this time and ordered fluid bolus every hour order discontinued.

## 2015-12-13 ENCOUNTER — Inpatient Hospital Stay: Payer: Medicare Other

## 2015-12-13 LAB — CBC
HEMATOCRIT: 34.3 % — AB (ref 40.0–52.0)
HEMOGLOBIN: 11.3 g/dL — AB (ref 13.0–18.0)
MCH: 30.6 pg (ref 26.0–34.0)
MCHC: 32.9 g/dL (ref 32.0–36.0)
MCV: 92.9 fL (ref 80.0–100.0)
Platelets: 139 10*3/uL — ABNORMAL LOW (ref 150–440)
RBC: 3.7 MIL/uL — ABNORMAL LOW (ref 4.40–5.90)
RDW: 15.1 % — ABNORMAL HIGH (ref 11.5–14.5)
WBC: 8.8 10*3/uL (ref 3.8–10.6)

## 2015-12-13 LAB — BASIC METABOLIC PANEL
Anion gap: 6 (ref 5–15)
BUN: 22 mg/dL — ABNORMAL HIGH (ref 6–20)
CALCIUM: 7.9 mg/dL — AB (ref 8.9–10.3)
CHLORIDE: 111 mmol/L (ref 101–111)
CO2: 26 mmol/L (ref 22–32)
CREATININE: 1.16 mg/dL (ref 0.61–1.24)
GFR, EST NON AFRICAN AMERICAN: 54 mL/min — AB (ref >60)
Glucose, Bld: 118 mg/dL — ABNORMAL HIGH (ref 65–99)
POTASSIUM: 3.9 mmol/L (ref 3.5–5.1)
Sodium: 143 mmol/L (ref 135–145)

## 2015-12-13 LAB — URINE CULTURE: CULTURE: NO GROWTH

## 2015-12-13 MED ORDER — IOHEXOL 350 MG/ML SOLN
75.0000 mL | Freq: Once | INTRAVENOUS | Status: AC | PRN
  Administered 2015-12-13: 75 mL via INTRAVENOUS

## 2015-12-13 MED ORDER — ENOXAPARIN SODIUM 40 MG/0.4ML ~~LOC~~ SOLN
40.0000 mg | SUBCUTANEOUS | Status: DC
  Administered 2015-12-13 – 2015-12-17 (×5): 40 mg via SUBCUTANEOUS
  Filled 2015-12-13 (×5): qty 0.4

## 2015-12-13 MED ORDER — VANCOMYCIN HCL 10 G IV SOLR
1250.0000 mg | INTRAVENOUS | Status: DC
  Filled 2015-12-13: qty 1250

## 2015-12-13 MED ORDER — ACETAMINOPHEN 325 MG PO TABS: 650.0000 mg | ORAL_TABLET | Freq: Four times a day (QID) | ORAL | Status: DC | PRN

## 2015-12-13 NOTE — Care Management (Signed)
Admitted from [facility] to icu due to sepsis and need for levophed drip.  CSW referral is present

## 2015-12-13 NOTE — Progress Notes (Signed)
Pt transferred to room 110. Rport given to nurse. VSS. Pt oriented to person. PRN morphine given for pain. Family at bedside.

## 2015-12-13 NOTE — Progress Notes (Signed)
[facility]                                                                                                                                                                                            Patient Demographics   Kirk Hubbard, is a 82 y.o. male, DOB - Apr 10, 1923, [id number]  Admit date - 12/12/2015   Admitting Physician Dustin Flock, MD  Outpatient Primary MD for the patient is HANDE,VISHWANATH, MD   LOS - 1  Subjective: Patient respiratory status is improved currently on 3 L of oxygen. He is off the Levophed drip. He is confused and unable to give really meeting full history.     Review of Systems:   CONSTITUTIONAL Unable to provide  Vitals:   Filed Vitals:   12/13/15 0700 12/13/15 0800 12/13/15 0900 12/13/15 1000  BP: 116/56 133/84 113/55 114/68  Pulse: 73 80 75 75  Temp: 99.1 F (37.3 C) 99.3 F (37.4 C) 99.1 F (37.3 C) 99 F (37.2 C)  TempSrc:      Resp: 21 21 21 21   Height:      Weight:      SpO2: 100% 100% 100% 100%    Wt Readings from Last 3 Encounters:  12/12/15 111.8 kg (246 lb 7.6 oz)  11/29/15 110.904 kg (244 lb 8 oz)  10/08/15 113.127 kg (249 lb 6.4 oz)     Intake/Output Summary (Last 24 hours) at 12/13/15 1035 Last data filed at 12/13/15 0758  Gross per 24 hour  Intake 2831.31 ml  Output   2055 ml  Net 776.31 ml    Physical Exam:   GENERAL: Chronically ill-appearing male HEAD, EYES, EARS, NOSE AND THROAT: Atraumatic, normocephalic. . Pupils equal and reactive to light. Sclerae anicteric. No conjunctival injection. No oro-pharyngeal erythema.  NECK: Supple. There is no jugular venous distention. No bruits, no lymphadenopathy, no thyromegaly.  HEART: Regular rate and rhythm,. No murmurs, no rubs, no clicks.  LUNGS: Decreased breath sounds bilaterally at the base No rales or rhonchi. No wheezes.  ABDOMEN: Soft, flat, nontender, nondistended. Has good bowel sounds. No hepatosplenomegaly  appreciated.  EXTREMITIES: No evidence of any cyanosis, clubbing, or peripheral edema.  +2 pedal and radial pulses bilaterally.  NEUROLOGIC: The patient is alert, awake, and oriented x3 with no focal motor or sensory deficits appreciated bilaterally.  SKIN: Moist and warm with no rashes appreciated.  Psych: Not anxious, depressed LN: No inguinal LN enlargement    Antibiotics   Anti-infectives    Start     Dose/Rate Route Frequency Ordered Stop   12/12/15 2200  piperacillin-tazobactam (ZOSYN)  IVPB 3.375 g     3.375 g 12.5 mL/hr over 240 Minutes Intravenous 3 times per day 12/12/15 0925     12/12/15 1500  vancomycin (VANCOCIN) IVPB 1000 mg/200 mL premix     1,000 mg 200 mL/hr over 60 Minutes Intravenous Every 24 hours 12/12/15 0949     12/12/15 0915  piperacillin-tazobactam (ZOSYN) IVPB 3.375 g  Status:  Discontinued     3.375 g 12.5 mL/hr over 240 Minutes Intravenous 3 times per day 12/12/15 0907 12/12/15 0925   12/12/15 0730  ceFEPIme (MAXIPIME) 2 g in dextrose 5 % 50 mL IVPB     2 g 100 mL/hr over 30 Minutes Intravenous  Once 12/12/15 0724 12/12/15 1022   12/12/15 0730  vancomycin (VANCOCIN) IVPB 1000 mg/200 mL premix     1,000 mg 200 mL/hr over 60 Minutes Intravenous  Once 12/12/15 0724 12/12/15 1022      Medications   Scheduled Meds: . antiseptic oral rinse  7 mL Mouth Rinse BID  . heparin  5,000 Units Subcutaneous 3 times per day  . piperacillin-tazobactam (ZOSYN)  IV  3.375 g Intravenous 3 times per day  . sodium chloride flush  3 mL Intravenous Q12H  . vancomycin  1,000 mg Intravenous Q24H   Continuous Infusions: . sodium chloride 125 mL/hr at 12/13/15 0600  . norepinephrine (LEVOPHED) Adult infusion Stopped (12/13/15 0145)   PRN Meds:.HYDROcodone-acetaminophen, morphine injection, ondansetron **OR** ondansetron (ZOFRAN) IV   Data Review:   Micro Results Recent Results (from the past 240 hour(s))  Urine culture     Status: None   Collection Time: 12/05/15   4:20 PM  Result Value Ref Range Status   Specimen Description URINE, RANDOM  Final   Special Requests NONE  Final   Culture NO GROWTH 2 DAYS  Final   Report Status 12/07/2015 FINAL  Final  Urine culture     Status: None   Collection Time: 12/12/15  7:39 AM  Result Value Ref Range Status   Specimen Description URINE, RANDOM  Final   Special Requests NONE  Final   Culture NO GROWTH 1 DAY  Final   Report Status 12/13/2015 FINAL  Final  MRSA PCR Screening     Status: None   Collection Time: 12/12/15 10:49 AM  Result Value Ref Range Status   MRSA by PCR NEGATIVE NEGATIVE Final    Comment:        The GeneXpert MRSA Assay (FDA approved for NASAL specimens only), is one component of a comprehensive MRSA colonization surveillance program. It is not intended to diagnose MRSA infection nor to guide or monitor treatment for MRSA infections.     Radiology Reports Dg Chest 2 View  12/01/2015  CLINICAL DATA:  82 year old male admitted for respiratory distress. Initial encounter. EXAM: CHEST  2 VIEW COMPARISON:  11/29/2015 and earlier. FINDINGS: Seated AP and lateral views of the chest. Stable cardiomegaly and mediastinal contours. Chronic mild left lung base scarring or atelectasis, but increased left lower lobe opacity compared to 2016. No superimposed pneumothorax, pulmonary edema, or pleural effusion. No acute osseous abnormality identified. IMPRESSION: Confluent left lower lobe opacity suspicious for pneumonia and/or aspiration in this setting. No associated pleural effusion. Electronically Signed   By: Genevie Ann M.D.   On: 12/01/2015 14:48   Ct Head Wo Contrast  12/12/2015  CLINICAL DATA:  Altered mental status, unresponsive EXAM: CT HEAD WITHOUT CONTRAST TECHNIQUE: Contiguous axial images were obtained from the base of the skull through the vertex without contrast.  COMPARISON:  11/29/2015 FINDINGS: Similar pattern of diffuse brain atrophy. Mild chronic white matter microvascular ischemic  changes about the ventricles. No acute intracranial hemorrhage, mass lesion, definite infarction, mass effect, focal edema, midline shift, herniation, hydrocephalus, or extra-axial fluid collection. Cisterns are patent. Cerebellar atrophy as well. Orbits are symmetric. Mastoids and sinuses remain clear. IMPRESSION: Stable atrophy and chronic white matter microvascular ischemic changes. No interval change or acute process by noncontrast CT. Electronically Signed   By: Jerilynn Mages.  Shick M.D.   On: 12/12/2015 08:11   Ct Head Wo Contrast  11/29/2015  CLINICAL DATA:  Altered mental status since 1 a.m. today EXAM: CT HEAD WITHOUT CONTRAST TECHNIQUE: Contiguous axial images were obtained from the base of the skull through the vertex without intravenous contrast. COMPARISON:  10/16/2009 FINDINGS: Calvarium is intact. Study limited by mild motion artifact. Moderate diffuse atrophy is noted. There is mild diffuse low attenuation in the deep white matter. No evidence of mass or vascular territory infarct. No hemorrhage or extra-axial fluid. IMPRESSION: Age-related involutional change with no acute findings Electronically Signed   By: Skipper Cliche M.D.   On: 11/29/2015 19:32   Dg Chest Portable 1 View  12/12/2015  CLINICAL DATA:  Central line placement EXAM: PORTABLE CHEST 1 VIEW COMPARISON:  12/12/2015 FINDINGS: Right central line tip in the lower SVC. No pneumothorax. Cardiomegaly with vascular congestion and mild pulmonary edema. Bibasilar atelectasis. IMPRESSION: Right central line placement with the tip in the lower SVC. No pneumothorax. Mild CHF.  Bibasilar atelectasis. Electronically Signed   By: Rolm Baptise M.D.   On: 12/12/2015 09:59   Dg Chest Port 1 View  12/12/2015  CLINICAL DATA:  Increasing respiratory distress EXAM: PORTABLE CHEST 1 VIEW COMPARISON:  12/01/2015 FINDINGS: Cardiac shadow remains enlarged. The overall inspiratory effort is decreased from the prior exam. Stable left basilar atelectatic changes  are noted. No focal confluent infiltrate is seen. No bony abnormality is noted. IMPRESSION: Stable left basilar atelectasis. Electronically Signed   By: Inez Catalina M.D.   On: 12/12/2015 07:53   Dg Chest Portable 1 View  11/29/2015  CLINICAL DATA:  82 year old male with altered mental status today. Bilateral leg edema. Cough and congestion. EXAM: PORTABLE CHEST 1 VIEW COMPARISON:  Chest x-ray 10/08/2015. FINDINGS: Lung volumes are slightly low. Bibasilar opacities (left greater than right) may reflect areas of atelectasis and/or consolidation. Possible small left pleural effusion. No right pleural effusion. Mild crowding of the pulmonary vasculature, accentuated by low lung volumes, without frank pulmonary edema. Mild cardiomegaly. Upper mediastinal contours are within normal limits. Atherosclerosis in the thoracic aorta. IMPRESSION: 1. Low lung volumes with bibasilar (left greater than right) areas of atelectasis and/or consolidation, with small left pleural effusion. 2. Atherosclerosis. Electronically Signed   By: Vinnie Langton M.D.   On: 11/29/2015 18:24     CBC  Recent Labs Lab 12/12/15 0716 12/13/15 0415  WBC 14.0* 8.8  HGB 12.4* 11.3*  HCT 38.3* 34.3*  PLT 212 139*  MCV 92.7 92.9  MCH 30.0 30.6  MCHC 32.3 32.9  RDW 14.9* 15.1*  LYMPHSABS 2.8  --   MONOABS 0.9  --   EOSABS 0.5  --   BASOSABS 0.1  --     Chemistries   Recent Labs Lab 12/12/15 0716 12/13/15 0415  NA 139 143  K 4.5 3.9  CL 102 111  CO2 28 26  GLUCOSE 177* 118*  BUN 28* 22*  CREATININE 2.07* 1.16  CALCIUM 8.3* 7.9*  AST 19  --  ALT 11*  --   ALKPHOS 68  --   BILITOT 0.7  --    ------------------------------------------------------------------------------------------------------------------ estimated creatinine clearance is 54.7 mL/min (by C-G formula based on Cr of 1.16). ------------------------------------------------------------------------------------------------------------------ No results  for input(s): HGBA1C in the last 72 hours. ------------------------------------------------------------------------------------------------------------------ No results for input(s): CHOL, HDL, LDLCALC, TRIG, CHOLHDL, LDLDIRECT in the last 72 hours. ------------------------------------------------------------------------------------------------------------------ No results for input(s): TSH, T4TOTAL, T3FREE, THYROIDAB in the last 72 hours.  Invalid input(s): FREET3 ------------------------------------------------------------------------------------------------------------------ No results for input(s): VITAMINB12, FOLATE, FERRITIN, TIBC, IRON, RETICCTPCT in the last 72 hours.  Coagulation profile  Recent Labs Lab 12/12/15 0716  INR 1.10    No results for input(s): DDIMER in the last 72 hours.  Cardiac Enzymes  Recent Labs Lab 12/12/15 0716  TROPONINI <0.03   ------------------------------------------------------------------------------------------------------------------ Invalid input(s): POCBNP    Assessment & Plan   IMPRESSION AND PLAN: Patient is a 82 year old white male recently hospitalized with pneumonia now being admitted with decrease in responsiveness  1. Acute encephalopathy due to sepsis:  Continue current antibiotics.  2. Sepsis likely related to pulmonary source.   Blood cultures urine cultures are negative to date I will obtain a CT scan of the chest to further evaluate.   3. Acute respiratory failure and likely related to recurrent pneumonia.  Obtain a CT of the chest to further value  4. Hypotension due to sepsis now resolved off pressor  5. COPD no evidence of acute exasperation we'll do when necessary nebs  6. CODE STATUS DO NOT RESUSCITATE if no further improvement over the next 24 hours we'll discuss with family in terms of  Long care treatment      Code Status Orders        Start     Ordered   12/12/15 0906  Do not attempt resuscitation  (DNR)   Continuous    Question Answer Comment  In the event of cardiac or respiratory ARREST Do not call a "code blue"   In the event of cardiac or respiratory ARREST Do not perform Intubation, CPR, defibrillation or ACLS   In the event of cardiac or respiratory ARREST Use medication by any route, position, wound care, and other measures to relive pain and suffering. May use oxygen, suction and manual treatment of airway obstruction as needed for comfort.      12/12/15 0906    Code Status History    Date Active Date Inactive Code Status Order ID Comments User Context   11/29/2015 11:12 PM 12/02/2015  6:25 PM DNR [id number]  Vaughan Basta, MD Inpatient   10/08/2015 10:35 PM 10/12/2015  6:45 PM DNR [id number]  Vaughan Basta, MD Inpatient    Advance Directive Documentation        Most Recent Value   Type of Advance Directive  Out of facility DNR (pink MOST or yellow form)   Pre-existing out of facility DNR order (yellow form or pink MOST form)     "MOST" Form in Place?             Consults none   DVT Prophylaxis   heparin  Lab Results  Component Value Date   PLT 139* 12/13/2015     Time Spent in minutes   28min  tranfer to floor  Dustin Flock M.D on 12/13/2015 at 10:35 AM  Between 7am to 6pm - Pager - [phone]  After 6pm go to [email or web address] - password [email or web address] [place] [facility]   Office  [phone]

## 2015-12-13 NOTE — Progress Notes (Signed)
[place] for Vancomycin and Zosyn (Day 2) Indication: rule out sepsis  No Known Allergies  Patient Measurements: Height: 6' (182.9 cm) (per family, appears more correct on visual) Weight: 246 lb 7.6 oz (111.8 kg) IBW/kg (Calculated) : 77.6  ABW: 94kg    Vital Signs: Temp: 98.8 F (37.1 C) (01/30 1100) Temp Source: Core (Comment) (01/30 0400) BP: 123/58 mmHg (01/30 1100) Pulse Rate: 73 (01/30 1100)   Recent Labs  12/12/15 0716 12/13/15 0415  WBC 14.0* 8.8  HGB 12.4* 11.3*  PLT 212 139*  CREATININE 2.07* 1.16   Estimated Creatinine Clearance: 54.7 mL/min (by C-G formula based on Cr of 1.16). No results for input(s): VANCOTROUGH, VANCOPEAK, VANCORANDOM, GENTTROUGH, GENTPEAK, GENTRANDOM, TOBRATROUGH, TOBRAPEAK, TOBRARND, AMIKACINPEAK, AMIKACINTROU, AMIKACIN in the last 72 hours.   Microbiology: Recent Results (from the past 720 hour(s))  Blood culture (routine x 2)     Status: None   Collection Time: 11/29/15  6:55 PM  Result Value Ref Range Status   Specimen Description BLOOD LEFT ASSIST CONTROL  Final   Special Requests   Final    BOTTLES DRAWN AEROBIC AND ANAEROBIC 8CC AERO [place] ANA   Culture NO GROWTH 5 DAYS  Final   Report Status 12/04/2015 FINAL  Final  Blood culture (routine x 2)     Status: None   Collection Time: 11/29/15  7:05 PM  Result Value Ref Range Status   Specimen Description BLOOD RIGHT ASSIST CONTROL  Final   Special Requests BOTTLES DRAWN AEROBIC AND ANAEROBIC [place]  Final   Culture NO GROWTH 5 DAYS  Final   Report Status 12/04/2015 FINAL  Final  Urine culture     Status: None   Collection Time: 12/05/15  4:20 PM  Result Value Ref Range Status   Specimen Description URINE, RANDOM  Final   Special Requests NONE  Final   Culture NO GROWTH 2 DAYS  Final   Report Status 12/07/2015 FINAL  Final  Blood Culture (routine x 2)     Status: None (Preliminary result)   Collection Time: 12/12/15  7:16 AM  Result Value Ref  Range Status   Specimen Description BLOOD LEFT ANTECUBITAL  Final   Special Requests BOTTLES DRAWN AEROBIC AND ANAEROBIC  1CC  Final   Culture NO GROWTH 1 DAY  Final   Report Status PENDING  Incomplete  Blood Culture (routine x 2)     Status: None (Preliminary result)   Collection Time: 12/12/15  7:16 AM  Result Value Ref Range Status   Specimen Description BLOOD LEFT HAND  Final   Special Requests BOTTLES DRAWN AEROBIC AND ANAEROBIC  1CC  Final   Culture NO GROWTH 1 DAY  Final   Report Status PENDING  Incomplete  Urine culture     Status: None   Collection Time: 12/12/15  7:39 AM  Result Value Ref Range Status   Specimen Description URINE, RANDOM  Final   Special Requests NONE  Final   Culture NO GROWTH 1 DAY  Final   Report Status 12/13/2015 FINAL  Final  MRSA PCR Screening     Status: None   Collection Time: 12/12/15 10:49 AM  Result Value Ref Range Status   MRSA by PCR NEGATIVE NEGATIVE Final    Comment:        The GeneXpert MRSA Assay (FDA approved for NASAL specimens only), is one component of a comprehensive MRSA colonization surveillance program. It is not intended to diagnose MRSA infection nor to  guide or monitor treatment for MRSA infections.     Medical History: Past Medical History  Diagnosis Date  . Hypertension   . Hyperlipidemia   . DJD (degenerative joint disease)   . History of gout   . History of kidney stones   . History of prostate cancer   . Renal disorder     kidney stones  . CHF (congestive heart failure) ([place])   . COPD (chronic obstructive pulmonary disease) ([place])   . DVT (deep venous thrombosis) ([place]) sept 2016  . Cancer [facility])     Assessment:    Pharmacy consulted to dose vancomycin and Zosyn for 82 yo male being treated for severe sepsis.  Patient currently on day 2 of Zosyn EI 3.375g IV Q8hr and vancomycin 1g IV Q24hr.  Goal of Therapy:  Vancomycin trough level 15-20 mcg/ml  Plan:  1. Zosyn: Will continue patient on Zosyn Ei  3.375g IV Q8hr.    2. Vancomycin: Will transition patient to vancomycin 1g IV Q12hr for goal trough of 15-20. Will obtain trough prior to afternoon dose on 2/1.    Pharmacy will continue to monitor and adjust per consult.   Currie Paris, Pharm.D   12/13/2015,3:50 PM

## 2015-12-13 NOTE — Clinical Social Work Note (Signed)
Clinical Social Work Assessment  Patient Details  Name: Kirk Hubbard MRN: [id number] Date of Birth: [birth year]  Date of referral:  12/13/15               Reason for consult:  Facility Placement, Other (Comment Required) (From [facility] (SNF) )                Permission sought to share information with:  Chartered certified accountant granted to share information::  Yes, Verbal Permission Granted  Name::      [employer]::   [place]   Relationship::     Contact Information:     Housing/Transportation Living arrangements for the past 2 months:  [place] of Information:  Adult Children, Other (Comment Required) (Sister in Sports coach, Conservator, museum/gallery ) Patient Interpreter Needed:  None Criminal Activity/Legal Involvement Pertinent to Current Situation/Hospitalization:  No - Comment as needed Significant Relationships:  Adult Children, Other Family Members Lives with:  Facility Resident, Self Do you feel safe going back to the place where you live?  Yes Need for family participation in patient care:  Yes (Comment)  Care giving concerns: Patient is from [facility] for short term rehab.    Social Worker assessment / plan:  Holiday representative (CSW) received consult that patient is from a facility. Patient and family are familiar to CSW from last admission. CSW met with patient and his granddaughter Kirk Hubbard and sister in law were at bedside. Patient did not participate in assessment and appeared to be in pain. Nurses were at bedside. Patient's granddaughter Kirk Hubbard is a physical therapist at [facility] [employer]. Per Kirk Hubbard patient was doing well and making good progress at [facility]. Kirk Hubbard requested CSW to call patient's son Kirk Hubbard. CSW contacted patient's son Kirk Hubbard. Son reported that he wants patient to return to [facility] to finish his rehab.   FL2 complete and faxed out. CSW left Armed forces operational officer at [facility] a voicemail.  CSW will continue to follow and assist as needed.   Employment status:  Retired Forensic scientist:  Medicare PT Recommendations:  Not assessed at this time Information / Referral to community resources:  [place]  Patient/Family's Response to care: Patient's son Kirk Hubbard prefers patient to return to [facility] for rehab.   Patient/Family's Understanding of and Emotional Response to Diagnosis, Current Treatment, and Prognosis: Granddaughter Kirk Hubbard and son Kirk Hubbard were pleasant and thanked CSW for visit.   Emotional Assessment Appearance:  Appears stated age Attitude/Demeanor/Rapport:    Affect (typically observed):  Pleasant Orientation:  Oriented to Self, Fluctuating Orientation (Suspected and/or reported Sundowners), Oriented to Place Alcohol / Substance use:  Not Applicable Psych involvement (Current and /or in the community):  No (Comment)  Discharge Needs  Concerns to be addressed:  Discharge Planning Concerns Readmission within the last 30 days:  Yes Current discharge risk:  Chronically ill Barriers to Discharge:  Continued Medical Work up   Loralyn Freshwater, LCSW 12/13/2015, 3:40 PM

## 2015-12-13 NOTE — NC FL2 (Signed)
[place] LEVEL OF CARE SCREENING TOOL     IDENTIFICATION  Patient Name: Kirk Hubbard Birthdate: [birth year] Sex: male Admission Date (Current Location): 12/12/2015  [place] and [place] Number:  Engineering geologist and Address:  [facility], [address] [place], [place] [address]      Provider Number: [id number]  Attending Physician Name and Address:  Dustin Flock, MD  Relative Name and Phone Number:       Current Level of Care: Hospital Recommended Level of Care: [place] Prior Approval Number:    Date Approved/Denied:   PASRR Number:  ([id number])  Discharge Plan: SNF    Current Diagnoses: Patient Active Problem List   Diagnosis Date Noted  . Sepsis ([place]) 12/12/2015  . Pressure ulcer 11/30/2015  . Pneumonia 11/29/2015  . COPD exacerbation ([place]) 11/29/2015  . Cellulitis 10/08/2015  . DVT (deep venous thrombosis) ([place]) 10/08/2015  . Lung nodule seen on imaging study 09/02/2015    Orientation RESPIRATION BLADDER Height & Weight     Self, Time, Situation, Place  Normal Incontinent Weight: 246 lb 7.6 oz (111.8 kg) Height:  6' (182.9 cm) (per family, appears more correct on visual)  BEHAVIORAL SYMPTOMS/MOOD NEUROLOGICAL BOWEL NUTRITION STATUS   (none )  (none ) Continent Diet (NPO to be advanced )  AMBULATORY STATUS COMMUNICATION OF NEEDS Skin   Extensive Assist Verbally PU Stage and Appropriate Care (Pressure Ulcer Stage 2: Location Sacrum. )                       Personal Care Assistance Level of Assistance  Feeding, Bathing, Dressing Bathing Assistance: Limited assistance Feeding assistance: Independent Dressing Assistance: Limited assistance     Functional Limitations Info    Sight Info: Adequate Hearing Info: Impaired Speech Info: Adequate    SPECIAL CARE FACTORS FREQUENCY  PT (By licensed PT), OT (By licensed OT)     PT Frequency:  (5) OT Frequency:  (5)             Contractures      Additional Factors Info  Code Status Code Status Info:  (DNR )             Current Medications (12/13/2015):  This is the current hospital active medication list Current Facility-Administered Medications  Medication Dose Route Frequency Provider Last Rate Last Dose  . 0.9 %  sodium chloride infusion   Intravenous Continuous Dustin Flock, MD 125 mL/hr at 12/13/15 0600    . acetaminophen (TYLENOL) tablet 650 mg  650 mg Oral Q6H PRN Flora Lipps, MD      . antiseptic oral rinse (CPC / CETYLPYRIDINIUM CHLORIDE 0.05%) solution 7 mL  7 mL Mouth Rinse BID Dustin Flock, MD   7 mL at 12/13/15 1022  . enoxaparin (LOVENOX) injection 40 mg  40 mg Subcutaneous Q24H Flora Lipps, MD      . HYDROcodone-acetaminophen (NORCO/VICODIN) 5-325 MG per tablet 1-2 tablet  1-2 tablet Oral Q4H PRN Dustin Flock, MD      . morphine 2 MG/ML injection 1 mg  1 mg Intravenous Q4H PRN Dustin Flock, MD   1 mg at 12/13/15 1124  . ondansetron (ZOFRAN) tablet 4 mg  4 mg Oral Q6H PRN Dustin Flock, MD       Or  . ondansetron (ZOFRAN) injection 4 mg  4 mg Intravenous Q6H PRN Dustin Flock, MD      . piperacillin-tazobactam (ZOSYN) IVPB 3.375 g  3.375 g Intravenous  3 times per day Pernell Dupre, RPH   3.375 g at 12/13/15 0610  . sodium chloride flush (NS) 0.9 % injection 3 mL  3 mL Intravenous Q12H Dustin Flock, MD   3 mL at 12/13/15 0758  . vancomycin (VANCOCIN) IVPB 1000 mg/200 mL premix  1,000 mg Intravenous Q24H Sheema M Hallaji, RPH   1,000 mg at 12/13/15 1426     Discharge Medications: Please see discharge summary for a list of discharge medications.  Relevant Imaging Results:  Relevant Lab Results:   Additional Information  (SSN: [id number])  Loralyn Freshwater, LCSW

## 2015-12-14 LAB — BASIC METABOLIC PANEL
ANION GAP: 6 (ref 5–15)
BUN: 11 mg/dL (ref 6–20)
CALCIUM: 8.2 mg/dL — AB (ref 8.9–10.3)
CO2: 27 mmol/L (ref 22–32)
Chloride: 107 mmol/L (ref 101–111)
Creatinine, Ser: 0.95 mg/dL (ref 0.61–1.24)
GFR calc Af Amer: >60 mL/min (ref >60)
GFR calc non Af Amer: >60 mL/min (ref >60)
GLUCOSE: 99 mg/dL (ref 65–99)
Potassium: 3.6 mmol/L (ref 3.5–5.1)
Sodium: 140 mmol/L (ref 135–145)

## 2015-12-14 MED ORDER — SODIUM CHLORIDE 0.9 % IV SOLN
3.0000 g | Freq: Four times a day (QID) | INTRAVENOUS | Status: DC
  Administered 2015-12-14 – 2015-12-20 (×24): 3 g via INTRAVENOUS
  Filled 2015-12-14 (×33): qty 3

## 2015-12-14 NOTE — Progress Notes (Signed)
[place] at [facility]                                                                                                                                                                                            Patient Demographics   Amias Zur, is a 82 y.o. male, DOB - 12/08/1922, [id number]  Admit date - 12/12/2015   Admitting Physician Dustin Flock, MD  Outpatient Primary MD for the patient is Tracie Harrier, MD   LOS - 2  Subjective: Patient had a CT scan which shows findings consistent with aspiration pneumonia   Review of Systems:   CONSTITUTIONAL Unable to provide  Vitals:   Filed Vitals:   12/13/15 2000 12/13/15 2100 12/13/15 2228 12/14/15 0517  BP: 112/47 134/70 150/53 130/87  Pulse: 81 88 92 88  Temp: 98.5 F (36.9 C)  98.2 F (36.8 C) 98.6 F (37 C)  TempSrc: Oral  Oral Oral  Resp: 21 21 17 20   Height:   5\' 8"  (1.727 m)   Weight:   110.496 kg (243 lb 9.6 oz)   SpO2: 96% 98% 95% 97%    Wt Readings from Last 3 Encounters:  12/13/15 110.496 kg (243 lb 9.6 oz)  11/29/15 110.904 kg (244 lb 8 oz)  10/08/15 113.127 kg (249 lb 6.4 oz)     Intake/Output Summary (Last 24 hours) at 12/14/15 1034 Last data filed at 12/14/15 0535  Gross per 24 hour  Intake   1100 ml  Output      0 ml  Net   1100 ml    Physical Exam:   GENERAL: Chronically ill-appearing male HEAD, EYES, EARS, NOSE AND THROAT: Atraumatic, normocephalic. . Pupils equal and reactive to light. Sclerae anicteric. No conjunctival injection. No oro-pharyngeal erythema.  NECK: Supple. There is no jugular venous distention. No bruits, no lymphadenopathy, no thyromegaly.  HEART: Regular rate and rhythm,. No murmurs, no rubs, no clicks.  LUNGS: Decreased breath sounds bilaterally at the base No rales or rhonchi. No wheezes.  ABDOMEN: Soft, flat, nontender, nondistended. Has good bowel sounds. No hepatosplenomegaly appreciated.  EXTREMITIES: No evidence of any  cyanosis, clubbing, or peripheral edema.  +2 pedal and radial pulses bilaterally.  NEUROLOGIC: The patient is alert, awake, and oriented x3 with no focal motor or sensory deficits appreciated bilaterally.  SKIN: Moist and warm with no rashes appreciated.  Psych: Not anxious, depressed LN: No inguinal LN enlargement    Antibiotics   Anti-infectives    Start     Dose/Rate Route Frequency Ordered Stop   12/14/15 1500  vancomycin (VANCOCIN) 1,250 mg in sodium chloride 0.9 %  250 mL IVPB  Status:  Discontinued     1,250 mg 166.7 mL/hr over 90 Minutes Intravenous Every 24 hours 12/13/15 1550 12/14/15 0844   12/12/15 2200  piperacillin-tazobactam (ZOSYN) IVPB 3.375 g     3.375 g 12.5 mL/hr over 240 Minutes Intravenous 3 times per day 12/12/15 0925     12/12/15 1500  vancomycin (VANCOCIN) IVPB 1000 mg/200 mL premix  Status:  Discontinued     1,000 mg 200 mL/hr over 60 Minutes Intravenous Every 24 hours 12/12/15 0949 12/13/15 1550   12/12/15 0915  piperacillin-tazobactam (ZOSYN) IVPB 3.375 g  Status:  Discontinued     3.375 g 12.5 mL/hr over 240 Minutes Intravenous 3 times per day 12/12/15 0907 12/12/15 0925   12/12/15 0730  ceFEPIme (MAXIPIME) 2 g in dextrose 5 % 50 mL IVPB     2 g 100 mL/hr over 30 Minutes Intravenous  Once 12/12/15 0724 12/12/15 1022   12/12/15 0730  vancomycin (VANCOCIN) IVPB 1000 mg/200 mL premix     1,000 mg 200 mL/hr over 60 Minutes Intravenous  Once 12/12/15 0724 12/12/15 1022      Medications   Scheduled Meds: . antiseptic oral rinse  7 mL Mouth Rinse BID  . enoxaparin (LOVENOX) injection  40 mg Subcutaneous Q24H  . piperacillin-tazobactam (ZOSYN)  IV  3.375 g Intravenous 3 times per day  . sodium chloride flush  3 mL Intravenous Q12H   Continuous Infusions: . sodium chloride 125 mL/hr at 12/14/15 0535   PRN Meds:.acetaminophen, HYDROcodone-acetaminophen, morphine injection, ondansetron **OR** ondansetron (ZOFRAN) IV   Data Review:   Micro  Results Recent Results (from the past 240 hour(s))  Urine culture     Status: None   Collection Time: 12/05/15  4:20 PM  Result Value Ref Range Status   Specimen Description URINE, RANDOM  Final   Special Requests NONE  Final   Culture NO GROWTH 2 DAYS  Final   Report Status 12/07/2015 FINAL  Final  Blood Culture (routine x 2)     Status: None (Preliminary result)   Collection Time: 12/12/15  7:16 AM  Result Value Ref Range Status   Specimen Description BLOOD LEFT ANTECUBITAL  Final   Special Requests BOTTLES DRAWN AEROBIC AND ANAEROBIC  1CC  Final   Culture NO GROWTH 1 DAY  Final   Report Status PENDING  Incomplete  Blood Culture (routine x 2)     Status: None (Preliminary result)   Collection Time: 12/12/15  7:16 AM  Result Value Ref Range Status   Specimen Description BLOOD LEFT HAND  Final   Special Requests BOTTLES DRAWN AEROBIC AND ANAEROBIC  1CC  Final   Culture NO GROWTH 1 DAY  Final   Report Status PENDING  Incomplete  Urine culture     Status: None   Collection Time: 12/12/15  7:39 AM  Result Value Ref Range Status   Specimen Description URINE, RANDOM  Final   Special Requests NONE  Final   Culture NO GROWTH 1 DAY  Final   Report Status 12/13/2015 FINAL  Final  MRSA PCR Screening     Status: None   Collection Time: 12/12/15 10:49 AM  Result Value Ref Range Status   MRSA by PCR NEGATIVE NEGATIVE Final    Comment:        The GeneXpert MRSA Assay (FDA approved for NASAL specimens only), is one component of a comprehensive MRSA colonization surveillance program. It is not intended to diagnose MRSA infection nor to guide or monitor  treatment for MRSA infections.     Radiology Reports Dg Chest 2 View  12/01/2015  CLINICAL DATA:  82 year old male admitted for respiratory distress. Initial encounter. EXAM: CHEST  2 VIEW COMPARISON:  11/29/2015 and earlier. FINDINGS: Seated AP and lateral views of the chest. Stable cardiomegaly and mediastinal contours. Chronic mild  left lung base scarring or atelectasis, but increased left lower lobe opacity compared to 2016. No superimposed pneumothorax, pulmonary edema, or pleural effusion. No acute osseous abnormality identified. IMPRESSION: Confluent left lower lobe opacity suspicious for pneumonia and/or aspiration in this setting. No associated pleural effusion. Electronically Signed   By: Genevie Ann M.D.   On: 12/01/2015 14:48   Ct Head Wo Contrast  12/12/2015  CLINICAL DATA:  Altered mental status, unresponsive EXAM: CT HEAD WITHOUT CONTRAST TECHNIQUE: Contiguous axial images were obtained from the base of the skull through the vertex without contrast. COMPARISON:  11/29/2015 FINDINGS: Similar pattern of diffuse brain atrophy. Mild chronic white matter microvascular ischemic changes about the ventricles. No acute intracranial hemorrhage, mass lesion, definite infarction, mass effect, focal edema, midline shift, herniation, hydrocephalus, or extra-axial fluid collection. Cisterns are patent. Cerebellar atrophy as well. Orbits are symmetric. Mastoids and sinuses remain clear. IMPRESSION: Stable atrophy and chronic white matter microvascular ischemic changes. No interval change or acute process by noncontrast CT. Electronically Signed   By: Jerilynn Mages.  Shick M.D.   On: 12/12/2015 08:11   Ct Head Wo Contrast  11/29/2015  CLINICAL DATA:  Altered mental status since 1 a.m. today EXAM: CT HEAD WITHOUT CONTRAST TECHNIQUE: Contiguous axial images were obtained from the base of the skull through the vertex without intravenous contrast. COMPARISON:  10/16/2009 FINDINGS: Calvarium is intact. Study limited by mild motion artifact. Moderate diffuse atrophy is noted. There is mild diffuse low attenuation in the deep white matter. No evidence of mass or vascular territory infarct. No hemorrhage or extra-axial fluid. IMPRESSION: Age-related involutional change with no acute findings Electronically Signed   By: Skipper Cliche M.D.   On: 11/29/2015 19:32    Ct Angio Chest Pe W/cm &/or Wo Cm  12/13/2015  CLINICAL DATA:  82 year old male found unresponsive at the rehabilitation facility. Back pain, shortness of breath and recent hospitalization for community-acquired pneumonia. EXAM: CT ANGIOGRAPHY CHEST WITH CONTRAST TECHNIQUE: Multidetector CT imaging of the chest was performed using the standard protocol during bolus administration of intravenous contrast. Multiplanar CT image reconstructions and MIPs were obtained to evaluate the vascular anatomy. CONTRAST:  59mL OMNIPAQUE IOHEXOL 350 MG/ML SOLN COMPARISON:  Chest x-ray 12/12/2015; most recent prior CT scan of the chest 10/08/2015 FINDINGS: Mediastinum: Enlarging right peritracheal adenopathy versus exophytic nodularity arising from the lower pole of the right thyroid gland. The more superior nodule measures 28 x 20 mm compared to 26 x 19 mm previously. A more inferior component has significantly increased in size presently measuring 27 x 20 mm compared to approximately 11 mm previously. The remainder the mediastinum is unremarkable. The thyroid gland appears atrophic. Unremarkable thoracic esophagus. Heart/Vascular: Adequate opacification of the pulmonary arteries to the segmental level. No central filling defect to suggest acute pulmonary embolus. The main pulmonary arteries within normal limits for size. Two vessel aortic arch morphology. The brachiocephalic artery and left common carotid artery share a common origin. Scattered atherosclerotic vascular calcifications without aneurysmal dilatation or evidence of dissection. Calcified and thickened aortic valve. The heart is within normal limits for size. No pericardial effusion. Calcifications present throughout the coronary arteries including the left main, left anterior descending, circumflex  and right. Lungs/Pleura: Moderate respiratory motion artifact. Patchy airspace opacity in the periphery of the left upper lung into a lesser extent in the right  infrahilar region concerning for regions of focal infiltrate. Additionally there is dependent atelectasis in the lower lobes and diffuse mild bronchial wall thickening. Bones/Soft Tissues: No acute fracture or aggressive appearing lytic or blastic osseous lesion. Upper Abdomen: Similar appearance of large bilobed otherwise simple appearing cyst exophytic from the upper pole of the right kidney. Diffuse fatty infiltration of the submucosa of the stomach is similar compared to prior. Colonic diverticulosis involving the splenic flexure of the colon. No acute abnormality. Review of the MIP images confirms the above findings. IMPRESSION: 1. Negative for acute pulmonary embolus. 2. Patchy airspace opacity in the periphery of the left upper lung and right infrahilar region concerning for a multifocal infectious/inflammatory process such as multifocal pneumonia. 3. Enlarging right paratracheal adenopathy versus exophytic nodularity arising from the lower pole of the right thyroid gland. The interval progression is substantial over the short interval since 10/08/2015 raising concern for a malignant neoplastic process. Consider dedicated thyroid ultrasound as the initial workup. The exophytic nodule from the lower pole on the right may be visible sonographically in which case it may be amenable to percutaneous biopsy. 4. Diffuse mild bronchial wall thickening which may reflect chronic bronchitis. 5. Dependent atelectasis. 6. Calcified and thickened aortic valve. Does the patient have a clinical history of aortic stenosis or regurgitation? 7. Left main and 3 vessel coronary artery calcifications. 8. Additional ancillary findings as above. Electronically Signed   By: Jacqulynn Cadet M.D.   On: 12/13/2015 15:03   Dg Chest Portable 1 View  12/12/2015  CLINICAL DATA:  Central line placement EXAM: PORTABLE CHEST 1 VIEW COMPARISON:  12/12/2015 FINDINGS: Right central line tip in the lower SVC. No pneumothorax. Cardiomegaly  with vascular congestion and mild pulmonary edema. Bibasilar atelectasis. IMPRESSION: Right central line placement with the tip in the lower SVC. No pneumothorax. Mild CHF.  Bibasilar atelectasis. Electronically Signed   By: Rolm Baptise M.D.   On: 12/12/2015 09:59   Dg Chest Port 1 View  12/12/2015  CLINICAL DATA:  Increasing respiratory distress EXAM: PORTABLE CHEST 1 VIEW COMPARISON:  12/01/2015 FINDINGS: Cardiac shadow remains enlarged. The overall inspiratory effort is decreased from the prior exam. Stable left basilar atelectatic changes are noted. No focal confluent infiltrate is seen. No bony abnormality is noted. IMPRESSION: Stable left basilar atelectasis. Electronically Signed   By: Inez Catalina M.D.   On: 12/12/2015 07:53   Dg Chest Portable 1 View  11/29/2015  CLINICAL DATA:  82 year old male with altered mental status today. Bilateral leg edema. Cough and congestion. EXAM: PORTABLE CHEST 1 VIEW COMPARISON:  Chest x-ray 10/08/2015. FINDINGS: Lung volumes are slightly low. Bibasilar opacities (left greater than right) may reflect areas of atelectasis and/or consolidation. Possible small left pleural effusion. No right pleural effusion. Mild crowding of the pulmonary vasculature, accentuated by low lung volumes, without frank pulmonary edema. Mild cardiomegaly. Upper mediastinal contours are within normal limits. Atherosclerosis in the thoracic aorta. IMPRESSION: 1. Low lung volumes with bibasilar (left greater than right) areas of atelectasis and/or consolidation, with small left pleural effusion. 2. Atherosclerosis. Electronically Signed   By: Vinnie Langton M.D.   On: 11/29/2015 18:24     CBC  Recent Labs Lab 12/12/15 0716 12/13/15 0415  WBC 14.0* 8.8  HGB 12.4* 11.3*  HCT 38.3* 34.3*  PLT 212 139*  MCV 92.7 92.9  MCH 30.0  30.6  MCHC 32.3 32.9  RDW 14.9* 15.1*  LYMPHSABS 2.8  --   MONOABS 0.9  --   EOSABS 0.5  --   BASOSABS 0.1  --     Chemistries   Recent Labs Lab  12/12/15 0716 12/13/15 0415 12/14/15 0721  NA 139 143 140  K 4.5 3.9 3.6  CL 102 111 107  CO2 28 26 27   GLUCOSE 177* 118* 99  BUN 28* 22* 11  CREATININE 2.07* 1.16 0.95  CALCIUM 8.3* 7.9* 8.2*  AST 19  --   --   ALT 11*  --   --   ALKPHOS 68  --   --   BILITOT 0.7  --   --    ------------------------------------------------------------------------------------------------------------------ estimated creatinine clearance is 62.3 mL/min (by C-G formula based on Cr of 0.95). ------------------------------------------------------------------------------------------------------------------ No results for input(s): HGBA1C in the last 72 hours. ------------------------------------------------------------------------------------------------------------------ No results for input(s): CHOL, HDL, LDLCALC, TRIG, CHOLHDL, LDLDIRECT in the last 72 hours. ------------------------------------------------------------------------------------------------------------------ No results for input(s): TSH, T4TOTAL, T3FREE, THYROIDAB in the last 72 hours.  Invalid input(s): FREET3 ------------------------------------------------------------------------------------------------------------------ No results for input(s): VITAMINB12, FOLATE, FERRITIN, TIBC, IRON, RETICCTPCT in the last 72 hours.  Coagulation profile  Recent Labs Lab 12/12/15 0716  INR 1.10    No results for input(s): DDIMER in the last 72 hours.  Cardiac Enzymes  Recent Labs Lab 12/12/15 0716  TROPONINI <0.03   ------------------------------------------------------------------------------------------------------------------ Invalid input(s): POCBNP    Assessment & Plan   IMPRESSION AND PLAN: Patient is a 82 year old white male recently hospitalized with pneumonia now being admitted with decrease in responsiveness  1. Acute encephalopathy due to sepsis:  Presentation consistent with aspiration pneumonia  2. Sepsis due to  aspiration pneumonia I will change patient's Zosyn to Unasyn  3. Acute respiratory failure and likely related to recurrent pneumonia.   Seen by speech not safe for eating today 4. Hypotension due to sepsis now resolved off pressor  5. COPD no evidence of acute exasperation we'll do when necessary nebs  6. CODE STATUS DO NOT RESUSCITATE      Code Status Orders        Start     Ordered   12/12/15 0906  Do not attempt resuscitation (DNR)   Continuous    Question Answer Comment  In the event of cardiac or respiratory ARREST Do not call a "code blue"   In the event of cardiac or respiratory ARREST Do not perform Intubation, CPR, defibrillation or ACLS   In the event of cardiac or respiratory ARREST Use medication by any route, position, wound care, and other measures to relive pain and suffering. May use oxygen, suction and manual treatment of airway obstruction as needed for comfort.      12/12/15 0906    Code Status History    Date Active Date Inactive Code Status Order ID Comments User Context   11/29/2015 11:12 PM 12/02/2015  6:25 PM DNR [id number]  Vaughan Basta, MD Inpatient   10/08/2015 10:35 PM 10/12/2015  6:45 PM DNR [id number]  Vaughan Basta, MD Inpatient    Advance Directive Documentation        Most Recent Value   Type of Advance Directive  Out of facility DNR (pink MOST or yellow form)   Pre-existing out of facility DNR order (yellow form or pink MOST form)     "MOST" Form in Place?             Consults none   DVT Prophylaxis   heparin  Lab Results  Component Value Date   PLT 139* 12/13/2015     Time Spent in minutes   61min  tranfer to floor  Dustin Flock M.D on 12/14/2015 at 10:34 AM  Between 7am to 6pm - Pager - [phone]  After 6pm go to [email or web address] - password [email or web address] [facility]   Office  [phone]

## 2015-12-14 NOTE — Plan of Care (Signed)
Problem: SLP Dysphagia Goals Goal: Misc Dysphagia Goal Pt will safely tolerate po diet of least restrictive consistency w/ no overt s/s of aspiration noted by Staff/pt/family x3 sessions.    

## 2015-12-14 NOTE — Consult Note (Signed)
Pharmacy Antibiotic Note  Kirk Hubbard is a 82 y.o. male admitted on 12/12/2015 with aspiration pneumonia.  Pharmacy has been consulted for Unasyn dosing.  Plan: Initiate Unasyn 3g IV q6hrs. Follow up on signs of clinical improvement and monitor for changes in renal function.  Height: 5\' 8"  (172.7 cm) Weight: 243 lb 9.6 oz (110.496 kg) IBW/kg (Calculated) : 68.4  Temp (24hrs), Avg:98.5 F (36.9 C), Min:98.2 F (36.8 C), Max:98.8 F (37.1 C)   Recent Labs Lab 12/12/15 0716 12/12/15 1007 12/13/15 0415 12/14/15 0721  WBC 14.0*  --  8.8  --   CREATININE 2.07*  --  1.16 0.95  LATICACIDVEN 2.9* 2.2*  --   --     Estimated Creatinine Clearance: 62.3 mL/min (by C-G formula based on Cr of 0.95).    No Known Allergies  Antimicrobials this admission: Anti-infectives    Start     Dose/Rate Route Frequency Ordered Stop   12/14/15 1500  vancomycin (VANCOCIN) 1,250 mg in sodium chloride 0.9 % 250 mL IVPB  Status:  Discontinued     1,250 mg 166.7 mL/hr over 90 Minutes Intravenous Every 24 hours 12/13/15 1550 12/14/15 0844   12/14/15 1200  Ampicillin-Sulbactam (UNASYN) 3 g in sodium chloride 0.9 % 100 mL IVPB     3 g 100 mL/hr over 60 Minutes Intravenous Every 6 hours 12/14/15 1043     12/12/15 2200  piperacillin-tazobactam (ZOSYN) IVPB 3.375 g  Status:  Discontinued     3.375 g 12.5 mL/hr over 240 Minutes Intravenous 3 times per day 12/12/15 0925 12/14/15 1039   12/12/15 1500  vancomycin (VANCOCIN) IVPB 1000 mg/200 mL premix  Status:  Discontinued     1,000 mg 200 mL/hr over 60 Minutes Intravenous Every 24 hours 12/12/15 0949 12/13/15 1550   12/12/15 0915  piperacillin-tazobactam (ZOSYN) IVPB 3.375 g  Status:  Discontinued     3.375 g 12.5 mL/hr over 240 Minutes Intravenous 3 times per day 12/12/15 0907 12/12/15 0925   12/12/15 0730  ceFEPIme (MAXIPIME) 2 g in dextrose 5 % 50 mL IVPB     2 g 100 mL/hr over 30 Minutes Intravenous  Once 12/12/15 0724 12/12/15 1022   12/12/15 0730   vancomycin (VANCOCIN) IVPB 1000 mg/200 mL premix     1,000 mg 200 mL/hr over 60 Minutes Intravenous  Once 12/12/15 0724 12/12/15 1022      Microbiology results: Results for orders placed or performed during the hospital encounter of 12/12/15  Blood Culture (routine x 2)     Status: None (Preliminary result)   Collection Time: 12/12/15  7:16 AM  Result Value Ref Range Status   Specimen Description BLOOD LEFT ANTECUBITAL  Final   Special Requests BOTTLES DRAWN AEROBIC AND ANAEROBIC  1CC  Final   Culture NO GROWTH 1 DAY  Final   Report Status PENDING  Incomplete  Blood Culture (routine x 2)     Status: None (Preliminary result)   Collection Time: 12/12/15  7:16 AM  Result Value Ref Range Status   Specimen Description BLOOD LEFT HAND  Final   Special Requests BOTTLES DRAWN AEROBIC AND ANAEROBIC  1CC  Final   Culture NO GROWTH 1 DAY  Final   Report Status PENDING  Incomplete  Urine culture     Status: None   Collection Time: 12/12/15  7:39 AM  Result Value Ref Range Status   Specimen Description URINE, RANDOM  Final   Special Requests NONE  Final   Culture NO GROWTH 1 DAY  Final   Report Status 12/13/2015 FINAL  Final  MRSA PCR Screening     Status: None   Collection Time: 12/12/15 10:49 AM  Result Value Ref Range Status   MRSA by PCR NEGATIVE NEGATIVE Final    Comment:        The GeneXpert MRSA Assay (FDA approved for NASAL specimens only), is one component of a comprehensive MRSA colonization surveillance program. It is not intended to diagnose MRSA infection nor to guide or monitor treatment for MRSA infections.     Thank you for allowing pharmacy to be a part of this patient's care.  Roe Coombs, PharmD Pharmacy Resident 12/14/2015

## 2015-12-14 NOTE — Care Management Important Message (Signed)
Important Message  Patient Details  Name: Kirk Hubbard MRN: [id number] Date of Birth: 26-Dec-1922   Medicare Important Message Given:  Yes    Juliann Pulse A Elisah Parmer 12/14/2015, 11:01 AM

## 2015-12-14 NOTE — Evaluation (Signed)
Clinical/Bedside Swallow Evaluation Patient Details  Name: KEIGO SCHIMPF MRN: [id number] Date of Birth: October 22, 1923  Today's Date: 12/14/2015 Time: SLP Start Time (ACUTE ONLY): [id number] SLP Stop Time (ACUTE ONLY): 0935 SLP Time Calculation (min) (ACUTE ONLY): 60 min  Past Medical History:  Past Medical History  Diagnosis Date  . Hypertension   . Hyperlipidemia   . DJD (degenerative joint disease)   . History of gout   . History of kidney stones   . History of prostate cancer   . Renal disorder     kidney stones  . CHF (congestive heart failure) ([place])   . COPD (chronic obstructive pulmonary disease) ([place])   . DVT (deep venous thrombosis) ([place]) sept 2016  . Cancer [facility])    Past Surgical History:  Past Surgical History  Procedure Laterality Date  . Kidney stone surgery     HPI:  Pt is a 82 y.o. male with a known history of hypertension, hyperlipidemia, mild dementia, history of gout, congestive heart failure, COPD who was actually hospitalized here with community-acquired pneumonia and was discharged to a rehabilitation facility. He is brought back due to unresponsiveness. Patient apparently was awake and alert 1 one hour prior per the nursing home staff. They found him unresponsive and called EMS upon EMS arrival patient was moaning to sternal rub but would not follow commands. Patient's blood pressure was noted to be 60/30. Patient also was hypothermic in the ER. His WBC count was elevated. He is also noticed to have elevated lactic acid and acute respiratory failure. Presently, pt has been sleepy/lethragic and only awaknes minimally w/ verbal/tactile stim. Pt is NPO.    Assessment / Plan / Recommendation Clinical Impression  Pt appears at increased risk for aspiration at this time sec. to his lethargic presentation, poor alertness despite verbal/tactile stim., and baseline Cognitive decline as well. Pt requires max. cues to attend and follow through w/ tasks. Pt was able to clear a few  trials given stim but would not be appropriate to begin an oral diet of various consistencies d/t the demand and level of cueing. With the cues given though, pt appeared able to tolerate few small tsp trials of Puree (applesauce) clearing them appropriately. Suspect pt may be able to tolerate necessary oral meds crushed in puree (as able) w/ NSG; strict aspiration precautions. ST will f/u w/ continued assessment of swallowing in order to initate an oral diet when appropriate. MD/NSG updated.     Aspiration Risk  Moderate aspiration risk    Diet Recommendation  NPO w/ consideration of any nec. PO meds in puree w/ NSG; strict aspiration precautions  Medication Administration: Crushed with puree    Other  Recommendations Recommended Consults:  (Dietician; consider palliative care consult) Oral Care Recommendations: Oral care QID;Staff/trained caregiver to provide oral care Other Recommendations:  (TBD)   Follow up Recommendations  Skilled Nursing facility (TBD)    Frequency and Duration min 3x week  2 weeks       Prognosis Prognosis for Safe Diet Advancement: Fair Barriers to Reach Goals: Cognitive deficits;Severity of deficits      Swallow Study   General Date of Onset: 12/12/15 HPI: Pt is a 82 y.o. male with a known history of hypertension, hyperlipidemia, mild dementia, history of gout, congestive heart failure, COPD who was actually hospitalized here with community-acquired pneumonia and was discharged to a rehabilitation facility. He is brought back due to unresponsiveness. Patient apparently was awake and alert 1 one hour prior per the nursing home  staff. They found him unresponsive and called EMS upon EMS arrival patient was moaning to sternal rub but would not follow commands. Patient's blood pressure was noted to be 60/30. Patient also was hypothermic in the ER. His WBC count was elevated. He is also noticed to have elevated lactic acid and acute respiratory failure. Presently, pt  has been sleepy/lethragic and only awaknes minimally w/ verbal/tactile stim. Pt is NPO.  Type of Study: Bedside Swallow Evaluation Previous Swallow Assessment: none Diet Prior to this Study: Regular;Thin liquids (but NPO since this admission) Temperature Spikes Noted: No (wbc 8.8) Respiratory Status: Nasal cannula (2-3 liters) History of Recent Intubation: No Behavior/Cognition: Lethargic/Drowsy;Requires cueing Oral Cavity Assessment: Dry (limited) Oral Care Completed by SLP: Recent completion by staff Oral Cavity - Dentition: Adequate natural dentition Vision:  (n/a) Self-Feeding Abilities: Total assist Patient Positioning: Upright in bed Baseline Vocal Quality:  (only phonated and "yes" x1) Volitional Cough: Cognitively unable to elicit Volitional Swallow: Unable to elicit    Oral/Motor/Sensory Function Overall Oral Motor/Sensory Function:  (unable to formally assess sec. to declined Cognitive status)   Ice Chips Ice chips: Impaired Presentation: Spoon (fed; 3 trials) Oral Phase Impairments: Poor awareness of bolus Oral Phase Functional Implications: Prolonged oral transit Pharyngeal Phase Impairments:  (none) Other Comments: pt was drowsy when not given cues/stim   Thin Liquid Thin Liquid: Not tested    Nectar Thick Nectar Thick Liquid: Not tested   Honey Thick Honey Thick Liquid: Not tested   Puree Puree: Impaired Presentation: Spoon (fed; 5 trials) Oral Phase Impairments: Poor awareness of bolus Oral Phase Functional Implications: Prolonged oral transit Pharyngeal Phase Impairments: Throat Clearing - Delayed (x1; no immediate) Other Comments: pt was drowsy when not given cues/stim   Solid   GO   Solid: Not tested        Orinda Kenner, MS, CCC-SLP  Ibeth Fahmy 12/14/2015,11:51 AM

## 2015-12-14 NOTE — Clinical Social Work Note (Signed)
CSW confirmed with facility that pt is able to return to [facility] when medically stable. CSW also followed up with pt's son, [name], as well. Pt's son is in agreement with discharge plan. CSW will continue to follow.   [name], MSW, LCSW  Clinical Social Worker  [phone]

## 2015-12-15 LAB — CBC
HEMATOCRIT: 35.4 % — AB (ref 40.0–52.0)
HEMOGLOBIN: 11.6 g/dL — AB (ref 13.0–18.0)
MCH: 31.2 pg (ref 26.0–34.0)
MCHC: 32.8 g/dL (ref 32.0–36.0)
MCV: 95.1 fL (ref 80.0–100.0)
Platelets: 149 10*3/uL — ABNORMAL LOW (ref 150–440)
RBC: 3.73 MIL/uL — ABNORMAL LOW (ref 4.40–5.90)
RDW: 14.8 % — AB (ref 11.5–14.5)
WBC: 6.8 10*3/uL (ref 3.8–10.6)

## 2015-12-15 LAB — BASIC METABOLIC PANEL
ANION GAP: 8 (ref 5–15)
BUN: 10 mg/dL (ref 6–20)
CALCIUM: 8.7 mg/dL — AB (ref 8.9–10.3)
CHLORIDE: 110 mmol/L (ref 101–111)
CO2: 27 mmol/L (ref 22–32)
Creatinine, Ser: 0.76 mg/dL (ref 0.61–1.24)
GFR calc Af Amer: >60 mL/min (ref >60)
GFR calc non Af Amer: >60 mL/min (ref >60)
GLUCOSE: 72 mg/dL (ref 65–99)
Potassium: 3.6 mmol/L (ref 3.5–5.1)
Sodium: 145 mmol/L (ref 135–145)

## 2015-12-15 NOTE — Progress Notes (Signed)
**Note Kirk-Identified via Obfuscation** Speech Language Pathology Treatment: Dysphagia  Patient Details Name: [name] MRN: [id number] DOB: 05/14/1923 Today's Date: 12/15/2015 Time: 1300-1400 SLP Time Calculation (min) (ACUTE ONLY): 60 min  Assessment / Plan / Recommendation Clinical Impression  Pt continues to present w/ increased risk for aspiration w/ po trials; noted min. Pharyngeal phase deficits including multiple swallowing to clear completely and a delayed throat clear during po trials of puree and Nectar consistency liquid via TSP(to control bolus size). Pt continues to present w/ slight drowsiness and confusion as well as declined respiratory status w/ min. Increased WOB w/ any exertion. MD consulted and stated pt's status is concerning for aspiration pneumonia. Rec. F/u w/ objective swallow study(MBSS) for further evaluation and hopefully initiation of po diet of least restriction. Reviewed chart notes. NSG updated.    HPI HPI: Pt is a 82 y.o. male with a known history of hypertension, hyperlipidemia, mild dementia, history of gout, congestive heart failure, COPD who was actually hospitalized here with community-acquired pneumonia and was discharged to a rehabilitation facility. He is brought back due to unresponsiveness. Patient apparently was awake and alert 1 one hour prior per the nursing home staff. They found him unresponsive and called EMS upon EMS arrival patient was moaning to sternal rub but would not follow commands. Patient's blood pressure was noted to be 60/30. Patient also was hypothermic in the ER. His WBC count was elevated. He is also noticed to have elevated lactic acid and acute respiratory failure. Presently, pt has been sleepy/lethragic and only awaknes minimally w/ verbal/tactile stim. Pt is NPO.       SLP Plan  Continue with current plan of care     Recommendations  Diet recommendations: NPO (w/ MBSS f/u tomorrow) Medication Administration: Crushed with puree (if po's are ordered by  MD) Supervision: Full supervision/cueing for compensatory strategies;Trained caregiver to feed patient Compensations: Minimize environmental distractions;Slow rate;Small sips/bites Postural Changes and/or Swallow Maneuvers: Seated upright 90 degrees             Oral Care Recommendations: Oral care QID;Staff/trained caregiver to provide oral care Follow up Recommendations: Skilled Nursing facility (TBD) Plan: Continue with current plan of care     Kirk Hubbard, CCC-SLP  Kirk Hubbard 12/15/2015, 3:44 PM

## 2015-12-15 NOTE — Progress Notes (Signed)
[place] at [facility]                                                                                                                                                                                            Patient Demographics   Kirk Hubbard, is a 82 y.o. male, DOB - Feb 19, 1923, [id number]  Admit date - 12/12/2015   Admitting Physician Dustin Flock, MD  Outpatient Primary MD for the patient is Tracie Harrier, MD   LOS - 3  Subjective: Speech at bedside,  breathing improved    Review of Systems:   CONSTITUTIONAL Unable to provide  Vitals:   Filed Vitals:   12/14/15 0517 12/14/15 1311 12/14/15 2213 12/15/15 0547  BP: 130/87 118/67 143/63 135/56  Pulse: 88 86 97 89  Temp: 98.6 F (37 C) 97.9 F (36.6 C) 98 F (36.7 C) 97.4 F (36.3 C)  TempSrc: Oral Oral Oral Oral  Resp: 20 20 22 20   Height:      Weight:      SpO2: 97% 98% 91% 97%    Wt Readings from Last 3 Encounters:  12/13/15 110.496 kg (243 lb 9.6 oz)  11/29/15 110.904 kg (244 lb 8 oz)  10/08/15 113.127 kg (249 lb 6.4 oz)    No intake or output data in the 24 hours ending 12/15/15 1340  Physical Exam:   GENERAL: Chronically ill-appearing male HEAD, EYES, EARS, NOSE AND THROAT: Atraumatic, normocephalic. . Pupils equal and reactive to light. Sclerae anicteric. No conjunctival injection. No oro-pharyngeal erythema.  NECK: Supple. There is no jugular venous distention. No bruits, no lymphadenopathy, no thyromegaly.  HEART: Regular rate and rhythm,. No murmurs, no rubs, no clicks.  LUNGS: Rhonchi bilaterally, no wheezing no sensory muscle usage ABDOMEN: Soft, flat, nontender, nondistended. Has good bowel sounds. No hepatosplenomegaly appreciated.  EXTREMITIES: No evidence of any cyanosis, clubbing, or peripheral edema.  +2 pedal and radial pulses bilaterally.  NEUROLOGIC: The patient is alert, awake, and oriented x3 with no focal motor or sensory deficits appreciated  bilaterally.  SKIN: Moist and warm with no rashes appreciated.  Psych: Not anxious, depressed LN: No inguinal LN enlargement    Antibiotics   Anti-infectives    Start     Dose/Rate Route Frequency Ordered Stop   12/14/15 1500  vancomycin (VANCOCIN) 1,250 mg in sodium chloride 0.9 % 250 mL IVPB  Status:  Discontinued     1,250 mg 166.7 mL/hr over 90 Minutes Intravenous Every 24 hours 12/13/15 1550 12/14/15 0844   12/14/15 1200  Ampicillin-Sulbactam (UNASYN) 3 g in sodium chloride 0.9 % 100 mL IVPB  3 g 100 mL/hr over 60 Minutes Intravenous Every 6 hours 12/14/15 1043     12/12/15 2200  piperacillin-tazobactam (ZOSYN) IVPB 3.375 g  Status:  Discontinued     3.375 g 12.5 mL/hr over 240 Minutes Intravenous 3 times per day 12/12/15 0925 12/14/15 1039   12/12/15 1500  vancomycin (VANCOCIN) IVPB 1000 mg/200 mL premix  Status:  Discontinued     1,000 mg 200 mL/hr over 60 Minutes Intravenous Every 24 hours 12/12/15 0949 12/13/15 1550   12/12/15 0915  piperacillin-tazobactam (ZOSYN) IVPB 3.375 g  Status:  Discontinued     3.375 g 12.5 mL/hr over 240 Minutes Intravenous 3 times per day 12/12/15 0907 12/12/15 0925   12/12/15 0730  ceFEPIme (MAXIPIME) 2 g in dextrose 5 % 50 mL IVPB     2 g 100 mL/hr over 30 Minutes Intravenous  Once 12/12/15 0724 12/12/15 1022   12/12/15 0730  vancomycin (VANCOCIN) IVPB 1000 mg/200 mL premix     1,000 mg 200 mL/hr over 60 Minutes Intravenous  Once 12/12/15 0724 12/12/15 1022      Medications   Scheduled Meds: . ampicillin-sulbactam (UNASYN) IV  3 g Intravenous Q6H  . antiseptic oral rinse  7 mL Mouth Rinse BID  . enoxaparin (LOVENOX) injection  40 mg Subcutaneous Q24H  . sodium chloride flush  3 mL Intravenous Q12H   Continuous Infusions:   PRN Meds:.acetaminophen, HYDROcodone-acetaminophen, morphine injection, ondansetron **OR** ondansetron (ZOFRAN) IV   Data Review:   Micro Results Recent Results (from the past 240 hour(s))  Urine culture      Status: None   Collection Time: 12/05/15  4:20 PM  Result Value Ref Range Status   Specimen Description URINE, RANDOM  Final   Special Requests NONE  Final   Culture NO GROWTH 2 DAYS  Final   Report Status 12/07/2015 FINAL  Final  Blood Culture (routine x 2)     Status: None (Preliminary result)   Collection Time: 12/12/15  7:16 AM  Result Value Ref Range Status   Specimen Description BLOOD LEFT ANTECUBITAL  Final   Special Requests BOTTLES DRAWN AEROBIC AND ANAEROBIC  1CC  Final   Culture NO GROWTH 2 DAYS  Final   Report Status PENDING  Incomplete  Blood Culture (routine x 2)     Status: None (Preliminary result)   Collection Time: 12/12/15  7:16 AM  Result Value Ref Range Status   Specimen Description BLOOD LEFT HAND  Final   Special Requests BOTTLES DRAWN AEROBIC AND ANAEROBIC  1CC  Final   Culture NO GROWTH 2 DAYS  Final   Report Status PENDING  Incomplete  Urine culture     Status: None   Collection Time: 12/12/15  7:39 AM  Result Value Ref Range Status   Specimen Description URINE, RANDOM  Final   Special Requests NONE  Final   Culture NO GROWTH 1 DAY  Final   Report Status 12/13/2015 FINAL  Final  MRSA PCR Screening     Status: None   Collection Time: 12/12/15 10:49 AM  Result Value Ref Range Status   MRSA by PCR NEGATIVE NEGATIVE Final    Comment:        The GeneXpert MRSA Assay (FDA approved for NASAL specimens only), is one component of a comprehensive MRSA colonization surveillance program. It is not intended to diagnose MRSA infection nor to guide or monitor treatment for MRSA infections.     Radiology Reports Dg Chest 2 View  12/01/2015  CLINICAL DATA:  82 year old male admitted for respiratory distress. Initial encounter. EXAM: CHEST  2 VIEW COMPARISON:  11/29/2015 and earlier. FINDINGS: Seated AP and lateral views of the chest. Stable cardiomegaly and mediastinal contours. Chronic mild left lung base scarring or atelectasis, but increased left lower  lobe opacity compared to 2016. No superimposed pneumothorax, pulmonary edema, or pleural effusion. No acute osseous abnormality identified. IMPRESSION: Confluent left lower lobe opacity suspicious for pneumonia and/or aspiration in this setting. No associated pleural effusion. Electronically Signed   By: Genevie Ann M.D.   On: 12/01/2015 14:48   Ct Head Wo Contrast  12/12/2015  CLINICAL DATA:  Altered mental status, unresponsive EXAM: CT HEAD WITHOUT CONTRAST TECHNIQUE: Contiguous axial images were obtained from the base of the skull through the vertex without contrast. COMPARISON:  11/29/2015 FINDINGS: Similar pattern of diffuse brain atrophy. Mild chronic white matter microvascular ischemic changes about the ventricles. No acute intracranial hemorrhage, mass lesion, definite infarction, mass effect, focal edema, midline shift, herniation, hydrocephalus, or extra-axial fluid collection. Cisterns are patent. Cerebellar atrophy as well. Orbits are symmetric. Mastoids and sinuses remain clear. IMPRESSION: Stable atrophy and chronic white matter microvascular ischemic changes. No interval change or acute process by noncontrast CT. Electronically Signed   By: Jerilynn Mages.  Shick M.D.   On: 12/12/2015 08:11   Ct Head Wo Contrast  11/29/2015  CLINICAL DATA:  Altered mental status since 1 a.m. today EXAM: CT HEAD WITHOUT CONTRAST TECHNIQUE: Contiguous axial images were obtained from the base of the skull through the vertex without intravenous contrast. COMPARISON:  10/16/2009 FINDINGS: Calvarium is intact. Study limited by mild motion artifact. Moderate diffuse atrophy is noted. There is mild diffuse low attenuation in the deep white matter. No evidence of mass or vascular territory infarct. No hemorrhage or extra-axial fluid. IMPRESSION: Age-related involutional change with no acute findings Electronically Signed   By: Skipper Cliche M.D.   On: 11/29/2015 19:32   Ct Angio Chest Pe W/cm &/or Wo Cm  12/13/2015  CLINICAL DATA:   82 year old male found unresponsive at the rehabilitation facility. Back pain, shortness of breath and recent hospitalization for community-acquired pneumonia. EXAM: CT ANGIOGRAPHY CHEST WITH CONTRAST TECHNIQUE: Multidetector CT imaging of the chest was performed using the standard protocol during bolus administration of intravenous contrast. Multiplanar CT image reconstructions and MIPs were obtained to evaluate the vascular anatomy. CONTRAST:  26mL OMNIPAQUE IOHEXOL 350 MG/ML SOLN COMPARISON:  Chest x-ray 12/12/2015; most recent prior CT scan of the chest 10/08/2015 FINDINGS: Mediastinum: Enlarging right peritracheal adenopathy versus exophytic nodularity arising from the lower pole of the right thyroid gland. The more superior nodule measures 28 x 20 mm compared to 26 x 19 mm previously. A more inferior component has significantly increased in size presently measuring 27 x 20 mm compared to approximately 11 mm previously. The remainder the mediastinum is unremarkable. The thyroid gland appears atrophic. Unremarkable thoracic esophagus. Heart/Vascular: Adequate opacification of the pulmonary arteries to the segmental level. No central filling defect to suggest acute pulmonary embolus. The main pulmonary arteries within normal limits for size. Two vessel aortic arch morphology. The brachiocephalic artery and left common carotid artery share a common origin. Scattered atherosclerotic vascular calcifications without aneurysmal dilatation or evidence of dissection. Calcified and thickened aortic valve. The heart is within normal limits for size. No pericardial effusion. Calcifications present throughout the coronary arteries including the left main, left anterior descending, circumflex and right. Lungs/Pleura: Moderate respiratory motion artifact. Patchy airspace opacity in the periphery of the left upper lung into a  lesser extent in the right infrahilar region concerning for regions of focal infiltrate. Additionally  there is dependent atelectasis in the lower lobes and diffuse mild bronchial wall thickening. Bones/Soft Tissues: No acute fracture or aggressive appearing lytic or blastic osseous lesion. Upper Abdomen: Similar appearance of large bilobed otherwise simple appearing cyst exophytic from the upper pole of the right kidney. Diffuse fatty infiltration of the submucosa of the stomach is similar compared to prior. Colonic diverticulosis involving the splenic flexure of the colon. No acute abnormality. Review of the MIP images confirms the above findings. IMPRESSION: 1. Negative for acute pulmonary embolus. 2. Patchy airspace opacity in the periphery of the left upper lung and right infrahilar region concerning for a multifocal infectious/inflammatory process such as multifocal pneumonia. 3. Enlarging right paratracheal adenopathy versus exophytic nodularity arising from the lower pole of the right thyroid gland. The interval progression is substantial over the short interval since 10/08/2015 raising concern for a malignant neoplastic process. Consider dedicated thyroid ultrasound as the initial workup. The exophytic nodule from the lower pole on the right may be visible sonographically in which case it may be amenable to percutaneous biopsy. 4. Diffuse mild bronchial wall thickening which may reflect chronic bronchitis. 5. Dependent atelectasis. 6. Calcified and thickened aortic valve. Does the patient have a clinical history of aortic stenosis or regurgitation? 7. Left main and 3 vessel coronary artery calcifications. 8. Additional ancillary findings as above. Electronically Signed   By: Jacqulynn Cadet M.D.   On: 12/13/2015 15:03   Dg Chest Portable 1 View  12/12/2015  CLINICAL DATA:  Central line placement EXAM: PORTABLE CHEST 1 VIEW COMPARISON:  12/12/2015 FINDINGS: Right central line tip in the lower SVC. No pneumothorax. Cardiomegaly with vascular congestion and mild pulmonary edema. Bibasilar atelectasis.  IMPRESSION: Right central line placement with the tip in the lower SVC. No pneumothorax. Mild CHF.  Bibasilar atelectasis. Electronically Signed   By: Rolm Baptise M.D.   On: 12/12/2015 09:59   Dg Chest Port 1 View  12/12/2015  CLINICAL DATA:  Increasing respiratory distress EXAM: PORTABLE CHEST 1 VIEW COMPARISON:  12/01/2015 FINDINGS: Cardiac shadow remains enlarged. The overall inspiratory effort is decreased from the prior exam. Stable left basilar atelectatic changes are noted. No focal confluent infiltrate is seen. No bony abnormality is noted. IMPRESSION: Stable left basilar atelectasis. Electronically Signed   By: Inez Catalina M.D.   On: 12/12/2015 07:53   Dg Chest Portable 1 View  11/29/2015  CLINICAL DATA:  82 year old male with altered mental status today. Bilateral leg edema. Cough and congestion. EXAM: PORTABLE CHEST 1 VIEW COMPARISON:  Chest x-ray 10/08/2015. FINDINGS: Lung volumes are slightly low. Bibasilar opacities (left greater than right) may reflect areas of atelectasis and/or consolidation. Possible small left pleural effusion. No right pleural effusion. Mild crowding of the pulmonary vasculature, accentuated by low lung volumes, without frank pulmonary edema. Mild cardiomegaly. Upper mediastinal contours are within normal limits. Atherosclerosis in the thoracic aorta. IMPRESSION: 1. Low lung volumes with bibasilar (left greater than right) areas of atelectasis and/or consolidation, with small left pleural effusion. 2. Atherosclerosis. Electronically Signed   By: Vinnie Langton M.D.   On: 11/29/2015 18:24     CBC  Recent Labs Lab 12/12/15 0716 12/13/15 0415 12/15/15 0640  WBC 14.0* 8.8 6.8  HGB 12.4* 11.3* 11.6*  HCT 38.3* 34.3* 35.4*  PLT 212 139* 149*  MCV 92.7 92.9 95.1  MCH 30.0 30.6 31.2  MCHC 32.3 32.9 32.8  RDW 14.9* 15.1* 14.8*  LYMPHSABS 2.8  --   --   MONOABS 0.9  --   --   EOSABS 0.5  --   --   BASOSABS 0.1  --   --     Chemistries   Recent Labs Lab  12/12/15 0716 12/13/15 0415 12/14/15 0721 12/15/15 0640  NA 139 143 140 145  K 4.5 3.9 3.6 3.6  CL 102 111 107 110  CO2 28 26 27 27   GLUCOSE 177* 118* 99 72  BUN 28* 22* 11 10  CREATININE 2.07* 1.16 0.95 0.76  CALCIUM 8.3* 7.9* 8.2* 8.7*  AST 19  --   --   --   ALT 11*  --   --   --   ALKPHOS 68  --   --   --   BILITOT 0.7  --   --   --    ------------------------------------------------------------------------------------------------------------------ estimated creatinine clearance is 74 mL/min (by C-G formula based on Cr of 0.76). ------------------------------------------------------------------------------------------------------------------ No results for input(s): HGBA1C in the last 72 hours. ------------------------------------------------------------------------------------------------------------------ No results for input(s): CHOL, HDL, LDLCALC, TRIG, CHOLHDL, LDLDIRECT in the last 72 hours. ------------------------------------------------------------------------------------------------------------------ No results for input(s): TSH, T4TOTAL, T3FREE, THYROIDAB in the last 72 hours.  Invalid input(s): FREET3 ------------------------------------------------------------------------------------------------------------------ No results for input(s): VITAMINB12, FOLATE, FERRITIN, TIBC, IRON, RETICCTPCT in the last 72 hours.  Coagulation profile  Recent Labs Lab 12/12/15 0716  INR 1.10    No results for input(s): DDIMER in the last 72 hours.  Cardiac Enzymes  Recent Labs Lab 12/12/15 0716  TROPONINI <0.03   ------------------------------------------------------------------------------------------------------------------ Invalid input(s): POCBNP    Assessment & Plan   IMPRESSION AND PLAN: Patient is a 82 year old white male recently hospitalized with pneumonia now being admitted with decrease in responsiveness  1. Acute encephalopathy due to sepsis:   Presentation consistent with aspiration pneumonia continue Unasyn  2. Sepsis due to aspiration pneumonia Continue  3. Acute respiratory failure and likely related to recurrent pneumonia.   Seen by speech  further recommendations pending  4. Hypotension due to sepsis now resolved off pressor  5. COPD no evidence of acute exasperation we'll do when necessary nebs  6. CODE STATUS DO NOT RESUSCITATE      Code Status Orders        Start     Ordered   12/12/15 0906  Do not attempt resuscitation (DNR)   Continuous    Question Answer Comment  In the event of cardiac or respiratory ARREST Do not call a "code blue"   In the event of cardiac or respiratory ARREST Do not perform Intubation, CPR, defibrillation or ACLS   In the event of cardiac or respiratory ARREST Use medication by any route, position, wound care, and other measures to relive pain and suffering. May use oxygen, suction and manual treatment of airway obstruction as needed for comfort.      12/12/15 0906    Code Status History    Date Active Date Inactive Code Status Order ID Comments User Context   11/29/2015 11:12 PM 12/02/2015  6:25 PM DNR [id number]  Vaughan Basta, MD Inpatient   10/08/2015 10:35 PM 10/12/2015  6:45 PM DNR [id number]  Vaughan Basta, MD Inpatient    Advance Directive Documentation        Most Recent Value   Type of Advance Directive  Out of facility DNR (pink MOST or yellow form)   Pre-existing out of facility DNR order (yellow form or pink MOST form)     "MOST" Form in Place?  Consults none   DVT Prophylaxis   heparin  Lab Results  Component Value Date   PLT 149* 12/15/2015     Time Spent in minutes   35min  tranfer to floor  Dustin Flock M.D on 12/15/2015 at 1:40 PM  Between 7am to 6pm - Pager - [phone]  After 6pm go to [email or web address] - password [email or web address] [facility]   Office  [phone]

## 2015-12-16 ENCOUNTER — Inpatient Hospital Stay: Payer: Medicare Other

## 2015-12-16 LAB — BLOOD GAS, VENOUS
Acid-Base Excess: 1.6 mmol/L (ref 0.0–3.0)
Bicarbonate: 28.9 mEq/L — ABNORMAL HIGH (ref 21.0–28.0)
PH VEN: 7.32 (ref 7.320–7.430)
Patient temperature: 37
pCO2, Ven: 56 mmHg (ref 44.0–60.0)

## 2015-12-16 NOTE — Plan of Care (Signed)
Problem: SLP Dysphagia Goals Goal: Misc Dysphagia Goal Pt will safely tolerate po diet of least restrictive consistency w/ no overt s/s of aspiration noted by Staff/pt/family x3 sessions.    

## 2015-12-16 NOTE — Progress Notes (Signed)
Pt had 3 runs of V tach at 2159. VSS. MD notified, continue to monitor closely.

## 2015-12-16 NOTE — Progress Notes (Signed)
Palliative Care follow-up: Patient admitted from SNF where he was completing rehab after hospitalization for pneumonia. Patient and family desire for patient to return to the facility to finish up the rehab that was started, where he can then be followed by Palliative Care and transition to Hospice care once appropriate. Will continue to follow throughout final disposition.  Atha Starks, MSW, LCSW Palliative Care Social Worker [phone]

## 2015-12-16 NOTE — Care Management Important Message (Signed)
Important Message  Patient Details  Name: Kirk Hubbard MRN: [id number] Date of Birth: 1922/11/21   Medicare Important Message Given:  Yes    Juliann Pulse A Mayela Bullard 12/16/2015, 9:54 AM

## 2015-12-16 NOTE — Progress Notes (Signed)
[place] at [facility]                                                                                                                                                                                            Patient Demographics   Jeromie Meziere, is a 82 y.o. male, DOB - 03-23-1923, [id number]  Admit date - 12/12/2015   Admitting Physician Dustin Flock, MD  Outpatient Primary MD for the patient is Tracie Harrier, MD   LOS - 4  Subjective: Patient having still having swallowing issues. He is confused    Review of Systems:   CONSTITUTIONAL Unable to provide  Vitals:   Filed Vitals:   12/15/15 0547 12/15/15 1352 12/15/15 2057 12/16/15 0526  BP: 135/56 131/61 134/55 139/70  Pulse: 89 92 96 95  Temp: 97.4 F (36.3 C) 97.7 F (36.5 C) 98 F (36.7 C) 97.6 F (36.4 C)  TempSrc: Oral Oral Oral Oral  Resp: 20 20 22    Height:      Weight:      SpO2: 97% 96% 96% 99%    Wt Readings from Last 3 Encounters:  12/13/15 110.496 kg (243 lb 9.6 oz)  11/29/15 110.904 kg (244 lb 8 oz)  10/08/15 113.127 kg (249 lb 6.4 oz)     Intake/Output Summary (Last 24 hours) at 12/16/15 1110 Last data filed at 12/15/15 1845  Gross per 24 hour  Intake      0 ml  Output    200 ml  Net   -200 ml    Physical Exam:   GENERAL: Chronically ill-appearing male HEAD, EYES, EARS, NOSE AND THROAT: Atraumatic, normocephalic. . Pupils equal and reactive to light. Sclerae anicteric. No conjunctival injection. No oro-pharyngeal erythema.  NECK: Supple. There is no jugular venous distention. No bruits, no lymphadenopathy, no thyromegaly.  HEART: Regular rate and rhythm,. No murmurs, no rubs, no clicks.  LUNGS: Rhonchi bilaterally, no wheezing no sensory muscle usage ABDOMEN: Soft, flat, nontender, nondistended. Has good bowel sounds. No hepatosplenomegaly appreciated.  EXTREMITIES: No evidence of any cyanosis, clubbing, or peripheral edema.  +2 pedal and radial  pulses bilaterally.  NEUROLOGIC: The patient is alert, awake, and oriented x3 with no focal motor or sensory deficits appreciated bilaterally.  SKIN: Moist and warm with no rashes appreciated.  Psych: Not anxious, depressed LN: No inguinal LN enlargement    Antibiotics   Anti-infectives    Start     Dose/Rate Route Frequency Ordered Stop   12/14/15 1500  vancomycin (VANCOCIN) 1,250 mg in sodium chloride 0.9 % 250 mL IVPB  Status:  Discontinued  1,250 mg 166.7 mL/hr over 90 Minutes Intravenous Every 24 hours 12/13/15 1550 12/14/15 0844   12/14/15 1200  Ampicillin-Sulbactam (UNASYN) 3 g in sodium chloride 0.9 % 100 mL IVPB     3 g 100 mL/hr over 60 Minutes Intravenous Every 6 hours 12/14/15 1043     12/12/15 2200  piperacillin-tazobactam (ZOSYN) IVPB 3.375 g  Status:  Discontinued     3.375 g 12.5 mL/hr over 240 Minutes Intravenous 3 times per day 12/12/15 0925 12/14/15 1039   12/12/15 1500  vancomycin (VANCOCIN) IVPB 1000 mg/200 mL premix  Status:  Discontinued     1,000 mg 200 mL/hr over 60 Minutes Intravenous Every 24 hours 12/12/15 0949 12/13/15 1550   12/12/15 0915  piperacillin-tazobactam (ZOSYN) IVPB 3.375 g  Status:  Discontinued     3.375 g 12.5 mL/hr over 240 Minutes Intravenous 3 times per day 12/12/15 0907 12/12/15 0925   12/12/15 0730  ceFEPIme (MAXIPIME) 2 g in dextrose 5 % 50 mL IVPB     2 g 100 mL/hr over 30 Minutes Intravenous  Once 12/12/15 0724 12/12/15 1022   12/12/15 0730  vancomycin (VANCOCIN) IVPB 1000 mg/200 mL premix     1,000 mg 200 mL/hr over 60 Minutes Intravenous  Once 12/12/15 0724 12/12/15 1022      Medications   Scheduled Meds: . ampicillin-sulbactam (UNASYN) IV  3 g Intravenous Q6H  . antiseptic oral rinse  7 mL Mouth Rinse BID  . enoxaparin (LOVENOX) injection  40 mg Subcutaneous Q24H  . sodium chloride flush  3 mL Intravenous Q12H   Continuous Infusions:   PRN Meds:.acetaminophen, HYDROcodone-acetaminophen, morphine injection,  ondansetron **OR** ondansetron (ZOFRAN) IV   Data Review:   Micro Results Recent Results (from the past 240 hour(s))  Blood Culture (routine x 2)     Status: None (Preliminary result)   Collection Time: 12/12/15  7:16 AM  Result Value Ref Range Status   Specimen Description BLOOD LEFT ANTECUBITAL  Final   Special Requests BOTTLES DRAWN AEROBIC AND ANAEROBIC  1CC  Final   Culture NO GROWTH 4 DAYS  Final   Report Status PENDING  Incomplete  Blood Culture (routine x 2)     Status: None (Preliminary result)   Collection Time: 12/12/15  7:16 AM  Result Value Ref Range Status   Specimen Description BLOOD LEFT HAND  Final   Special Requests BOTTLES DRAWN AEROBIC AND ANAEROBIC  1CC  Final   Culture NO GROWTH 4 DAYS  Final   Report Status PENDING  Incomplete  Urine culture     Status: None   Collection Time: 12/12/15  7:39 AM  Result Value Ref Range Status   Specimen Description URINE, RANDOM  Final   Special Requests NONE  Final   Culture NO GROWTH 1 DAY  Final   Report Status 12/13/2015 FINAL  Final  MRSA PCR Screening     Status: None   Collection Time: 12/12/15 10:49 AM  Result Value Ref Range Status   MRSA by PCR NEGATIVE NEGATIVE Final    Comment:        The GeneXpert MRSA Assay (FDA approved for NASAL specimens only), is one component of a comprehensive MRSA colonization surveillance program. It is not intended to diagnose MRSA infection nor to guide or monitor treatment for MRSA infections.     Radiology Reports Dg Chest 2 View  12/01/2015  CLINICAL DATA:  82 year old male admitted for respiratory distress. Initial encounter. EXAM: CHEST  2 VIEW COMPARISON:  11/29/2015 and earlier.  FINDINGS: Seated AP and lateral views of the chest. Stable cardiomegaly and mediastinal contours. Chronic mild left lung base scarring or atelectasis, but increased left lower lobe opacity compared to 2016. No superimposed pneumothorax, pulmonary edema, or pleural effusion. No acute osseous  abnormality identified. IMPRESSION: Confluent left lower lobe opacity suspicious for pneumonia and/or aspiration in this setting. No associated pleural effusion. Electronically Signed   By: Genevie Ann M.D.   On: 12/01/2015 14:48   Ct Head Wo Contrast  12/12/2015  CLINICAL DATA:  Altered mental status, unresponsive EXAM: CT HEAD WITHOUT CONTRAST TECHNIQUE: Contiguous axial images were obtained from the base of the skull through the vertex without contrast. COMPARISON:  11/29/2015 FINDINGS: Similar pattern of diffuse brain atrophy. Mild chronic white matter microvascular ischemic changes about the ventricles. No acute intracranial hemorrhage, mass lesion, definite infarction, mass effect, focal edema, midline shift, herniation, hydrocephalus, or extra-axial fluid collection. Cisterns are patent. Cerebellar atrophy as well. Orbits are symmetric. Mastoids and sinuses remain clear. IMPRESSION: Stable atrophy and chronic white matter microvascular ischemic changes. No interval change or acute process by noncontrast CT. Electronically Signed   By: Jerilynn Mages.  Shick M.D.   On: 12/12/2015 08:11   Ct Head Wo Contrast  11/29/2015  CLINICAL DATA:  Altered mental status since 1 a.m. today EXAM: CT HEAD WITHOUT CONTRAST TECHNIQUE: Contiguous axial images were obtained from the base of the skull through the vertex without intravenous contrast. COMPARISON:  10/16/2009 FINDINGS: Calvarium is intact. Study limited by mild motion artifact. Moderate diffuse atrophy is noted. There is mild diffuse low attenuation in the deep white matter. No evidence of mass or vascular territory infarct. No hemorrhage or extra-axial fluid. IMPRESSION: Age-related involutional change with no acute findings Electronically Signed   By: Skipper Cliche M.D.   On: 11/29/2015 19:32   Ct Angio Chest Pe W/cm &/or Wo Cm  12/13/2015  CLINICAL DATA:  82 year old male found unresponsive at the rehabilitation facility. Back pain, shortness of breath and recent  hospitalization for community-acquired pneumonia. EXAM: CT ANGIOGRAPHY CHEST WITH CONTRAST TECHNIQUE: Multidetector CT imaging of the chest was performed using the standard protocol during bolus administration of intravenous contrast. Multiplanar CT image reconstructions and MIPs were obtained to evaluate the vascular anatomy. CONTRAST:  8mL OMNIPAQUE IOHEXOL 350 MG/ML SOLN COMPARISON:  Chest x-ray 12/12/2015; most recent prior CT scan of the chest 10/08/2015 FINDINGS: Mediastinum: Enlarging right peritracheal adenopathy versus exophytic nodularity arising from the lower pole of the right thyroid gland. The more superior nodule measures 28 x 20 mm compared to 26 x 19 mm previously. A more inferior component has significantly increased in size presently measuring 27 x 20 mm compared to approximately 11 mm previously. The remainder the mediastinum is unremarkable. The thyroid gland appears atrophic. Unremarkable thoracic esophagus. Heart/Vascular: Adequate opacification of the pulmonary arteries to the segmental level. No central filling defect to suggest acute pulmonary embolus. The main pulmonary arteries within normal limits for size. Two vessel aortic arch morphology. The brachiocephalic artery and left common carotid artery share a common origin. Scattered atherosclerotic vascular calcifications without aneurysmal dilatation or evidence of dissection. Calcified and thickened aortic valve. The heart is within normal limits for size. No pericardial effusion. Calcifications present throughout the coronary arteries including the left main, left anterior descending, circumflex and right. Lungs/Pleura: Moderate respiratory motion artifact. Patchy airspace opacity in the periphery of the left upper lung into a lesser extent in the right infrahilar region concerning for regions of focal infiltrate. Additionally there is dependent atelectasis  in the lower lobes and diffuse mild bronchial wall thickening. Bones/Soft  Tissues: No acute fracture or aggressive appearing lytic or blastic osseous lesion. Upper Abdomen: Similar appearance of large bilobed otherwise simple appearing cyst exophytic from the upper pole of the right kidney. Diffuse fatty infiltration of the submucosa of the stomach is similar compared to prior. Colonic diverticulosis involving the splenic flexure of the colon. No acute abnormality. Review of the MIP images confirms the above findings. IMPRESSION: 1. Negative for acute pulmonary embolus. 2. Patchy airspace opacity in the periphery of the left upper lung and right infrahilar region concerning for a multifocal infectious/inflammatory process such as multifocal pneumonia. 3. Enlarging right paratracheal adenopathy versus exophytic nodularity arising from the lower pole of the right thyroid gland. The interval progression is substantial over the short interval since 10/08/2015 raising concern for a malignant neoplastic process. Consider dedicated thyroid ultrasound as the initial workup. The exophytic nodule from the lower pole on the right may be visible sonographically in which case it may be amenable to percutaneous biopsy. 4. Diffuse mild bronchial wall thickening which may reflect chronic bronchitis. 5. Dependent atelectasis. 6. Calcified and thickened aortic valve. Does the patient have a clinical history of aortic stenosis or regurgitation? 7. Left main and 3 vessel coronary artery calcifications. 8. Additional ancillary findings as above. Electronically Signed   By: Jacqulynn Cadet M.D.   On: 12/13/2015 15:03   Dg Chest Portable 1 View  12/12/2015  CLINICAL DATA:  Central line placement EXAM: PORTABLE CHEST 1 VIEW COMPARISON:  12/12/2015 FINDINGS: Right central line tip in the lower SVC. No pneumothorax. Cardiomegaly with vascular congestion and mild pulmonary edema. Bibasilar atelectasis. IMPRESSION: Right central line placement with the tip in the lower SVC. No pneumothorax. Mild CHF.  Bibasilar  atelectasis. Electronically Signed   By: Rolm Baptise M.D.   On: 12/12/2015 09:59   Dg Chest Port 1 View  12/12/2015  CLINICAL DATA:  Increasing respiratory distress EXAM: PORTABLE CHEST 1 VIEW COMPARISON:  12/01/2015 FINDINGS: Cardiac shadow remains enlarged. The overall inspiratory effort is decreased from the prior exam. Stable left basilar atelectatic changes are noted. No focal confluent infiltrate is seen. No bony abnormality is noted. IMPRESSION: Stable left basilar atelectasis. Electronically Signed   By: Inez Catalina M.D.   On: 12/12/2015 07:53   Dg Chest Portable 1 View  11/29/2015  CLINICAL DATA:  82 year old male with altered mental status today. Bilateral leg edema. Cough and congestion. EXAM: PORTABLE CHEST 1 VIEW COMPARISON:  Chest x-ray 10/08/2015. FINDINGS: Lung volumes are slightly low. Bibasilar opacities (left greater than right) may reflect areas of atelectasis and/or consolidation. Possible small left pleural effusion. No right pleural effusion. Mild crowding of the pulmonary vasculature, accentuated by low lung volumes, without frank pulmonary edema. Mild cardiomegaly. Upper mediastinal contours are within normal limits. Atherosclerosis in the thoracic aorta. IMPRESSION: 1. Low lung volumes with bibasilar (left greater than right) areas of atelectasis and/or consolidation, with small left pleural effusion. 2. Atherosclerosis. Electronically Signed   By: Vinnie Langton M.D.   On: 11/29/2015 18:24     CBC  Recent Labs Lab 12/12/15 0716 12/13/15 0415 12/15/15 0640  WBC 14.0* 8.8 6.8  HGB 12.4* 11.3* 11.6*  HCT 38.3* 34.3* 35.4*  PLT 212 139* 149*  MCV 92.7 92.9 95.1  MCH 30.0 30.6 31.2  MCHC 32.3 32.9 32.8  RDW 14.9* 15.1* 14.8*  LYMPHSABS 2.8  --   --   MONOABS 0.9  --   --  EOSABS 0.5  --   --   BASOSABS 0.1  --   --     Chemistries   Recent Labs Lab 12/12/15 0716 12/13/15 0415 12/14/15 0721 12/15/15 0640  NA 139 143 140 145  K 4.5 3.9 3.6 3.6  CL 102  111 107 110  CO2 28 26 27 27   GLUCOSE 177* 118* 99 72  BUN 28* 22* 11 10  CREATININE 2.07* 1.16 0.95 0.76  CALCIUM 8.3* 7.9* 8.2* 8.7*  AST 19  --   --   --   ALT 11*  --   --   --   ALKPHOS 68  --   --   --   BILITOT 0.7  --   --   --    ------------------------------------------------------------------------------------------------------------------ estimated creatinine clearance is 74 mL/min (by C-G formula based on Cr of 0.76). ------------------------------------------------------------------------------------------------------------------ No results for input(s): HGBA1C in the last 72 hours. ------------------------------------------------------------------------------------------------------------------ No results for input(s): CHOL, HDL, LDLCALC, TRIG, CHOLHDL, LDLDIRECT in the last 72 hours. ------------------------------------------------------------------------------------------------------------------ No results for input(s): TSH, T4TOTAL, T3FREE, THYROIDAB in the last 72 hours.  Invalid input(s): FREET3 ------------------------------------------------------------------------------------------------------------------ No results for input(s): VITAMINB12, FOLATE, FERRITIN, TIBC, IRON, RETICCTPCT in the last 72 hours.  Coagulation profile  Recent Labs Lab 12/12/15 0716  INR 1.10    No results for input(s): DDIMER in the last 72 hours.  Cardiac Enzymes  Recent Labs Lab 12/12/15 0716  TROPONINI <0.03   ------------------------------------------------------------------------------------------------------------------ Invalid input(s): POCBNP    Assessment & Plan   IMPRESSION AND PLAN: Patient is a 82 year old white male recently hospitalized with pneumonia now being admitted with decrease in responsiveness  1. Acute encephalopathy due to sepsis:  Presentation consistent with aspiration pneumonia continue Unasyn no significant improvement I have been updating  the son on regular basis Prognosis very poor Modified barium swallow today Palliative care consult I have told son is not a good candidate for PEG tube placement  2. Sepsis due to aspiration pneumonia Continue  3. Acute respiratory failure and likely related to recurrent pneumonia.   Seen by speech  further recommendations pending  4. Hypotension due to sepsis now resolved off pressor  5. COPD no evidence of acute exasperation we'll do when necessary nebs  6. CODE STATUS DO NOT RESUSCITATE      Code Status Orders        Start     Ordered   12/12/15 0906  Do not attempt resuscitation (DNR)   Continuous    Question Answer Comment  In the event of cardiac or respiratory ARREST Do not call a "code blue"   In the event of cardiac or respiratory ARREST Do not perform Intubation, CPR, defibrillation or ACLS   In the event of cardiac or respiratory ARREST Use medication by any route, position, wound care, and other measures to relive pain and suffering. May use oxygen, suction and manual treatment of airway obstruction as needed for comfort.      12/12/15 0906    Code Status History    Date Active Date Inactive Code Status Order ID Comments User Context   11/29/2015 11:12 PM 12/02/2015  6:25 PM DNR [id number]  Vaughan Basta, MD Inpatient   10/08/2015 10:35 PM 10/12/2015  6:45 PM DNR [id number]  Vaughan Basta, MD Inpatient    Advance Directive Documentation        Most Recent Value   Type of Advance Directive  Out of facility DNR (pink MOST or yellow form)   Pre-existing out of facility DNR  order (yellow form or pink MOST form)     "MOST" Form in Place?             Consults none   DVT Prophylaxis   heparin  Lab Results  Component Value Date   PLT 149* 12/15/2015     Time Spent in minutes   75min  tranfer to floor  Dustin Flock M.D on 12/16/2015 at 11:10 AM  Between 7am to 6pm - Pager - [phone]  After 6pm go to [email or web address] - password [email or web address]  [facility]   Office  [phone]

## 2015-12-16 NOTE — Evaluation (Signed)
Objective Swallowing Evaluation: Type of Study: Bedside Swallow Evaluation  Patient Details  Name: Kirk Hubbard MRN: [id number] Date of Birth: 02-14-1923  Today's Date: 12/16/2015 Time: SLP Start Time (ACUTE ONLY): 1230-SLP Stop Time (ACUTE ONLY): 1330 SLP Time Calculation (min) (ACUTE ONLY): 60 min  Past Medical History:  Past Medical History  Diagnosis Date  . Hypertension   . Hyperlipidemia   . DJD (degenerative joint disease)   . History of gout   . History of kidney stones   . History of prostate cancer   . Renal disorder     kidney stones  . CHF (congestive heart failure) ([place])   . COPD (chronic obstructive pulmonary disease) ([place])   . DVT (deep venous thrombosis) ([place]) sept 2016  . Cancer [facility])    Past Surgical History:  Past Surgical History  Procedure Laterality Date  . Kidney stone surgery     HPI: Pt is a 82 y.o. male with a known history of hypertension, hyperlipidemia, mild dementia, history of gout, congestive heart failure, COPD who was actually hospitalized here with community-acquired pneumonia and was discharged to a rehabilitation facility. He is brought back due to unresponsiveness. Patient apparently was awake and alert 1 one hour prior per the nursing home staff. They found him unresponsive and called EMS upon EMS arrival patient was moaning to sternal rub but would not follow commands. Patient's blood pressure was noted to be 60/30. Patient also was hypothermic in the ER. His WBC count was elevated. He is also noticed to have elevated lactic acid and acute respiratory failure. Presently, pt has been sleepy/lethragic and only awaknes minimally w/ verbal/tactile stim. Pt is NPO.   Subjective: Patient awake and verbally responsive.   Subjective: Patient behavior: (alertness, ability to follow instructions, etc.): Chief complaint: concern for aspiration secondary pneumonia   Objective:  Radiological Procedure: A videoflouroscopic evaluation of oral-preparatory,  reflex initiation, and pharyngeal phases of the swallow was performed; as well as a screening of the upper esophageal phase.  I. POSTURE: Upright in MBS chair  II. VIEW: Lateral  III. COMPENSATORY STRATEGIES: N/A  IV. BOLUSES ADMINISTERED:   Thin Liquid: 2 teaspoon boluses, 2 self-administered cup rim sips   Nectar-thick Liquid: 2 teaspoon boluses, 1 self-administered cup rim sips    Puree: 2 teaspoon boluses   Mechanical Soft: 1/4 gram cracker in applesauce  V. RESULTS OF EVALUATION: A. ORAL PREPARATORY PHASE: (The lips, tongue, and velum are observed for strength and coordination) Disorganized oral phase with reduced bolus cohesion, pre-swallow spill into valleculae, and trace-to-mild oral residue       **Overall Severity Rating: Mild  B. SWALLOW INITIATION/REFLEX: (The reflex is normal if "triggered" by the time the bolus reached the base of the tongue): Triggers while falling from the valleculae to the pyriform sinuses  **Overall Severity Rating: Mild-moderate  C. PHARYNGEAL PHASE: (Pharyngeal function is normal if the bolus shows rapid, smooth, and continuous transit through the pharynx and there is no pharyngeal residue after the swallow) Once triggered, within normal limits.  No significant pharyngeal residue  **Overall Severity Rating: WNL  D. LARYNGEAL PENETRATION: (Material entering into the laryngeal inlet/vestibule but not aspirated) Transient laryngeal penetration with small (teaspoon) liquid boluses.  Not observed with larger, self-administered cup rim sips (nectar-thick and thin liquids).  E. ASPIRATION: None  F. ESOPHAGEAL PHASE: (Screening of the upper esophagus) Poor view of cervical esophagus secondary shoulder shadow  ASSESSMENT: 82 year old man, with history of pneumonia, is presenting with mild oropharyngeal dysphagia characterized  by disorganized oral management and delayed pharyngeal swallow initiation.  Once a pharyngeal swallow is initiated, the pharyngeal  phase of swallowing is completed within normal limits.  There is inconsistent laryngeal penetration of liquids and no aspiration.  The patient is observed to have a stronger, more timely swallow with self-administered cup rim sips vs. teaspoon bolus given by SLP.  The patient was observed to clear his throat, but there was no observed laryngeal penetration or aspiration to elicit the throat clearing.  Throat clearing while eating and drinking would not be an indicator of aspiration.  This study supports a Dysphagia III with thin liquids.  Recommend that patient be supervised but be allowed to feed himself.   PLAN/RECOMMENDATIONS:   A. Diet: Dysphagia III with thin liquids   B. Swallowing Precautions: Upright for meals, allow to self-feed, encourage alternating liquids and solids, monitor for coughing   C. Recommended consultation to N/A   D. Therapy recommendations: SLP to follow up   E. Results and recommendations were discussed with treating SLP.  Assessment / Plan / Recommendation  CHL IP CLINICAL IMPRESSIONS 12/14/2015  Therapy Diagnosis --  Clinical Impression --  Impact on safety and function Moderate aspiration risk      CHL IP TREATMENT RECOMMENDATION 12/14/2015  Treatment Recommendations Therapy as outlined in treatment plan below     Prognosis 12/14/2015  Prognosis for Safe Diet Advancement Fair  Barriers to Reach Goals Cognitive deficits;Severity of deficits  Barriers/Prognosis Comment --    CHL IP DIET RECOMMENDATION 12/15/2015  SLP Diet Recommendations --  Liquid Administration via --  Medication Administration --  Compensations Minimize environmental distractions;Slow rate;Small sips/bites  Postural Changes --      CHL IP OTHER RECOMMENDATIONS 12/14/2015  Recommended Consults --  Oral Care Recommendations --  Other Recommendations (No Data)      CHL IP FOLLOW UP RECOMMENDATIONS 12/15/2015  Follow up Recommendations Skilled Nursing facility      [facility] IP FREQUENCY  AND DURATION 12/14/2015  Speech Therapy Frequency (ACUTE ONLY) min 3x week  Treatment Duration 2 weeks           No flowsheet data found.  No flowsheet data found.   No flowsheet data found.  No flowsheet data found.   Leroy Sea, MS/CCC- SLP  Lou Miner 12/16/2015, 1:43 PM

## 2015-12-17 ENCOUNTER — Inpatient Hospital Stay: Payer: Medicare Other

## 2015-12-17 LAB — CULTURE, BLOOD (ROUTINE X 2)
CULTURE: NO GROWTH
CULTURE: NO GROWTH

## 2015-12-17 MED ORDER — IPRATROPIUM-ALBUTEROL 0.5-2.5 (3) MG/3ML IN SOLN
3.0000 mL | Freq: Four times a day (QID) | RESPIRATORY_TRACT | Status: DC
  Administered 2015-12-17 – 2015-12-20 (×11): 3 mL via RESPIRATORY_TRACT
  Filled 2015-12-17 (×11): qty 3

## 2015-12-17 MED ORDER — METHYLPREDNISOLONE SODIUM SUCC 125 MG IJ SOLR
60.0000 mg | Freq: Four times a day (QID) | INTRAMUSCULAR | Status: DC
  Administered 2015-12-17 – 2015-12-19 (×8): 60 mg via INTRAVENOUS
  Filled 2015-12-17 (×8): qty 2

## 2015-12-17 MED ORDER — FUROSEMIDE 10 MG/ML IJ SOLN
20.0000 mg | Freq: Two times a day (BID) | INTRAMUSCULAR | Status: DC
  Administered 2015-12-17 – 2015-12-20 (×7): 20 mg via INTRAVENOUS
  Filled 2015-12-17 (×7): qty 2

## 2015-12-17 NOTE — Progress Notes (Signed)
[place] [facility]                                                                                                                                                                                            Patient Demographics   Kirk Hubbard, is a 82 y.o. male, DOB - 31-Mar-1923, [id number]  Admit date - 12/12/2015   Admitting Physician Dustin Flock, MD  Outpatient Primary MD for the patient is Tracie Harrier, MD   LOS - 5  Subjective: Patient is confused was seen by speech on a modified diet    Review of Systems:   CONSTITUTIONAL Unable to provide  Vitals:   Filed Vitals:   12/16/15 1342 12/16/15 2133 12/17/15 0104 12/17/15 0507  BP: 145/68 169/63 128/71 150/65  Pulse: 89 91 86 85  Temp: 97.1 F (36.2 C) 97.9 F (36.6 C)  98 F (36.7 C)  TempSrc: Oral Oral  Oral  Resp: 18 26 24 24   Height:      Weight:      SpO2: 100% 95% 97% 97%    Wt Readings from Last 3 Encounters:  12/13/15 110.496 kg (243 lb 9.6 oz)  11/29/15 110.904 kg (244 lb 8 oz)  10/08/15 113.127 kg (249 lb 6.4 oz)     Intake/Output Summary (Last 24 hours) at 12/17/15 1209 Last data filed at 12/17/15 0800  Gross per 24 hour  Intake    360 ml  Output      0 ml  Net    360 ml    Physical Exam:   GENERAL: Chronically ill-appearing male HEAD, EYES, EARS, NOSE AND THROAT: Atraumatic, normocephalic. . Pupils equal and reactive to light. Sclerae anicteric. No conjunctival injection. No oro-pharyngeal erythema.  NECK: Supple. There is no jugular venous distention. No bruits, no lymphadenopathy, no thyromegaly.  HEART: Regular rate and rhythm,. No murmurs, no rubs, no clicks.  LUNGS: Bilateral rhonchi and wheezing noted throughout both lung ABDOMEN: Soft, flat, nontender, nondistended. Has good bowel sounds. No hepatosplenomegaly appreciated.  EXTREMITIES: No evidence of any cyanosis, clubbing, or peripheral edema.  +2 pedal and radial pulses bilaterally.   NEUROLOGIC: The patient is alert, awake, and oriented x3 with no focal motor or sensory deficits appreciated bilaterally.  SKIN: Moist and warm with no rashes appreciated.  Psych: Not anxious, depressed LN: No inguinal LN enlargement    Antibiotics   Anti-infectives    Start     Dose/Rate Route Frequency Ordered Stop   12/14/15 1500  vancomycin (VANCOCIN) 1,250 mg in sodium chloride 0.9 % 250 mL IVPB  Status:  Discontinued  1,250 mg 166.7 mL/hr over 90 Minutes Intravenous Every 24 hours 12/13/15 1550 12/14/15 0844   12/14/15 1200  Ampicillin-Sulbactam (UNASYN) 3 g in sodium chloride 0.9 % 100 mL IVPB     3 g 100 mL/hr over 60 Minutes Intravenous Every 6 hours 12/14/15 1043     12/12/15 2200  piperacillin-tazobactam (ZOSYN) IVPB 3.375 g  Status:  Discontinued     3.375 g 12.5 mL/hr over 240 Minutes Intravenous 3 times per day 12/12/15 0925 12/14/15 1039   12/12/15 1500  vancomycin (VANCOCIN) IVPB 1000 mg/200 mL premix  Status:  Discontinued     1,000 mg 200 mL/hr over 60 Minutes Intravenous Every 24 hours 12/12/15 0949 12/13/15 1550   12/12/15 0915  piperacillin-tazobactam (ZOSYN) IVPB 3.375 g  Status:  Discontinued     3.375 g 12.5 mL/hr over 240 Minutes Intravenous 3 times per day 12/12/15 0907 12/12/15 0925   12/12/15 0730  ceFEPIme (MAXIPIME) 2 g in dextrose 5 % 50 mL IVPB     2 g 100 mL/hr over 30 Minutes Intravenous  Once 12/12/15 0724 12/12/15 1022   12/12/15 0730  vancomycin (VANCOCIN) IVPB 1000 mg/200 mL premix     1,000 mg 200 mL/hr over 60 Minutes Intravenous  Once 12/12/15 0724 12/12/15 1022      Medications   Scheduled Meds: . ampicillin-sulbactam (UNASYN) IV  3 g Intravenous Q6H  . antiseptic oral rinse  7 mL Mouth Rinse BID  . enoxaparin (LOVENOX) injection  40 mg Subcutaneous Q24H  . ipratropium-albuterol  3 mL Nebulization Q6H  . methylPREDNISolone (SOLU-MEDROL) injection  60 mg Intravenous Q6H  . sodium chloride flush  3 mL Intravenous Q12H    Continuous Infusions:   PRN Meds:.acetaminophen, HYDROcodone-acetaminophen, morphine injection, ondansetron **OR** ondansetron (ZOFRAN) IV   Data Review:   Micro Results Recent Results (from the past 240 hour(s))  Blood Culture (routine x 2)     Status: None   Collection Time: 12/12/15  7:16 AM  Result Value Ref Range Status   Specimen Description BLOOD LEFT ANTECUBITAL  Final   Special Requests BOTTLES DRAWN AEROBIC AND ANAEROBIC  1CC  Final   Culture NO GROWTH 5 DAYS  Final   Report Status 12/17/2015 FINAL  Final  Blood Culture (routine x 2)     Status: None   Collection Time: 12/12/15  7:16 AM  Result Value Ref Range Status   Specimen Description BLOOD LEFT HAND  Final   Special Requests BOTTLES DRAWN AEROBIC AND ANAEROBIC  1CC  Final   Culture NO GROWTH 5 DAYS  Final   Report Status 12/17/2015 FINAL  Final  Urine culture     Status: None   Collection Time: 12/12/15  7:39 AM  Result Value Ref Range Status   Specimen Description URINE, RANDOM  Final   Special Requests NONE  Final   Culture NO GROWTH 1 DAY  Final   Report Status 12/13/2015 FINAL  Final  MRSA PCR Screening     Status: None   Collection Time: 12/12/15 10:49 AM  Result Value Ref Range Status   MRSA by PCR NEGATIVE NEGATIVE Final    Comment:        The GeneXpert MRSA Assay (FDA approved for NASAL specimens only), is one component of a comprehensive MRSA colonization surveillance program. It is not intended to diagnose MRSA infection nor to guide or monitor treatment for MRSA infections.     Radiology Reports Dg Chest 2 View  12/01/2015  CLINICAL DATA:  82 year old male  admitted for respiratory distress. Initial encounter. EXAM: CHEST  2 VIEW COMPARISON:  11/29/2015 and earlier. FINDINGS: Seated AP and lateral views of the chest. Stable cardiomegaly and mediastinal contours. Chronic mild left lung base scarring or atelectasis, but increased left lower lobe opacity compared to 2016. No superimposed  pneumothorax, pulmonary edema, or pleural effusion. No acute osseous abnormality identified. IMPRESSION: Confluent left lower lobe opacity suspicious for pneumonia and/or aspiration in this setting. No associated pleural effusion. Electronically Signed   By: Genevie Ann M.D.   On: 12/01/2015 14:48   Ct Head Wo Contrast  12/12/2015  CLINICAL DATA:  Altered mental status, unresponsive EXAM: CT HEAD WITHOUT CONTRAST TECHNIQUE: Contiguous axial images were obtained from the base of the skull through the vertex without contrast. COMPARISON:  11/29/2015 FINDINGS: Similar pattern of diffuse brain atrophy. Mild chronic white matter microvascular ischemic changes about the ventricles. No acute intracranial hemorrhage, mass lesion, definite infarction, mass effect, focal edema, midline shift, herniation, hydrocephalus, or extra-axial fluid collection. Cisterns are patent. Cerebellar atrophy as well. Orbits are symmetric. Mastoids and sinuses remain clear. IMPRESSION: Stable atrophy and chronic white matter microvascular ischemic changes. No interval change or acute process by noncontrast CT. Electronically Signed   By: Jerilynn Mages.  Shick M.D.   On: 12/12/2015 08:11   Ct Head Wo Contrast  11/29/2015  CLINICAL DATA:  Altered mental status since 1 a.m. today EXAM: CT HEAD WITHOUT CONTRAST TECHNIQUE: Contiguous axial images were obtained from the base of the skull through the vertex without intravenous contrast. COMPARISON:  10/16/2009 FINDINGS: Calvarium is intact. Study limited by mild motion artifact. Moderate diffuse atrophy is noted. There is mild diffuse low attenuation in the deep white matter. No evidence of mass or vascular territory infarct. No hemorrhage or extra-axial fluid. IMPRESSION: Age-related involutional change with no acute findings Electronically Signed   By: Skipper Cliche M.D.   On: 11/29/2015 19:32   Ct Angio Chest Pe W/cm &/or Wo Cm  12/13/2015  CLINICAL DATA:  82 year old male found unresponsive at the  rehabilitation facility. Back pain, shortness of breath and recent hospitalization for community-acquired pneumonia. EXAM: CT ANGIOGRAPHY CHEST WITH CONTRAST TECHNIQUE: Multidetector CT imaging of the chest was performed using the standard protocol during bolus administration of intravenous contrast. Multiplanar CT image reconstructions and MIPs were obtained to evaluate the vascular anatomy. CONTRAST:  61mL OMNIPAQUE IOHEXOL 350 MG/ML SOLN COMPARISON:  Chest x-ray 12/12/2015; most recent prior CT scan of the chest 10/08/2015 FINDINGS: Mediastinum: Enlarging right peritracheal adenopathy versus exophytic nodularity arising from the lower pole of the right thyroid gland. The more superior nodule measures 28 x 20 mm compared to 26 x 19 mm previously. A more inferior component has significantly increased in size presently measuring 27 x 20 mm compared to approximately 11 mm previously. The remainder the mediastinum is unremarkable. The thyroid gland appears atrophic. Unremarkable thoracic esophagus. Heart/Vascular: Adequate opacification of the pulmonary arteries to the segmental level. No central filling defect to suggest acute pulmonary embolus. The main pulmonary arteries within normal limits for size. Two vessel aortic arch morphology. The brachiocephalic artery and left common carotid artery share a common origin. Scattered atherosclerotic vascular calcifications without aneurysmal dilatation or evidence of dissection. Calcified and thickened aortic valve. The heart is within normal limits for size. No pericardial effusion. Calcifications present throughout the coronary arteries including the left main, left anterior descending, circumflex and right. Lungs/Pleura: Moderate respiratory motion artifact. Patchy airspace opacity in the periphery of the left upper lung into a lesser extent  in the right infrahilar region concerning for regions of focal infiltrate. Additionally there is dependent atelectasis in the lower  lobes and diffuse mild bronchial wall thickening. Bones/Soft Tissues: No acute fracture or aggressive appearing lytic or blastic osseous lesion. Upper Abdomen: Similar appearance of large bilobed otherwise simple appearing cyst exophytic from the upper pole of the right kidney. Diffuse fatty infiltration of the submucosa of the stomach is similar compared to prior. Colonic diverticulosis involving the splenic flexure of the colon. No acute abnormality. Review of the MIP images confirms the above findings. IMPRESSION: 1. Negative for acute pulmonary embolus. 2. Patchy airspace opacity in the periphery of the left upper lung and right infrahilar region concerning for a multifocal infectious/inflammatory process such as multifocal pneumonia. 3. Enlarging right paratracheal adenopathy versus exophytic nodularity arising from the lower pole of the right thyroid gland. The interval progression is substantial over the short interval since 10/08/2015 raising concern for a malignant neoplastic process. Consider dedicated thyroid ultrasound as the initial workup. The exophytic nodule from the lower pole on the right may be visible sonographically in which case it may be amenable to percutaneous biopsy. 4. Diffuse mild bronchial wall thickening which may reflect chronic bronchitis. 5. Dependent atelectasis. 6. Calcified and thickened aortic valve. Does the patient have a clinical history of aortic stenosis or regurgitation? 7. Left main and 3 vessel coronary artery calcifications. 8. Additional ancillary findings as above. Electronically Signed   By: Jacqulynn Cadet M.D.   On: 12/13/2015 15:03   Dg Chest Portable 1 View  12/12/2015  CLINICAL DATA:  Central line placement EXAM: PORTABLE CHEST 1 VIEW COMPARISON:  12/12/2015 FINDINGS: Right central line tip in the lower SVC. No pneumothorax. Cardiomegaly with vascular congestion and mild pulmonary edema. Bibasilar atelectasis. IMPRESSION: Right central line placement with  the tip in the lower SVC. No pneumothorax. Mild CHF.  Bibasilar atelectasis. Electronically Signed   By: Rolm Baptise M.D.   On: 12/12/2015 09:59   Dg Chest Port 1 View  12/12/2015  CLINICAL DATA:  Increasing respiratory distress EXAM: PORTABLE CHEST 1 VIEW COMPARISON:  12/01/2015 FINDINGS: Cardiac shadow remains enlarged. The overall inspiratory effort is decreased from the prior exam. Stable left basilar atelectatic changes are noted. No focal confluent infiltrate is seen. No bony abnormality is noted. IMPRESSION: Stable left basilar atelectasis. Electronically Signed   By: Inez Catalina M.D.   On: 12/12/2015 07:53   Dg Chest Portable 1 View  11/29/2015  CLINICAL DATA:  82 year old male with altered mental status today. Bilateral leg edema. Cough and congestion. EXAM: PORTABLE CHEST 1 VIEW COMPARISON:  Chest x-ray 10/08/2015. FINDINGS: Lung volumes are slightly low. Bibasilar opacities (left greater than right) may reflect areas of atelectasis and/or consolidation. Possible small left pleural effusion. No right pleural effusion. Mild crowding of the pulmonary vasculature, accentuated by low lung volumes, without frank pulmonary edema. Mild cardiomegaly. Upper mediastinal contours are within normal limits. Atherosclerosis in the thoracic aorta. IMPRESSION: 1. Low lung volumes with bibasilar (left greater than right) areas of atelectasis and/or consolidation, with small left pleural effusion. 2. Atherosclerosis. Electronically Signed   By: Vinnie Langton M.D.   On: 11/29/2015 18:24     CBC  Recent Labs Lab 12/12/15 0716 12/13/15 0415 12/15/15 0640  WBC 14.0* 8.8 6.8  HGB 12.4* 11.3* 11.6*  HCT 38.3* 34.3* 35.4*  PLT 212 139* 149*  MCV 92.7 92.9 95.1  MCH 30.0 30.6 31.2  MCHC 32.3 32.9 32.8  RDW 14.9* 15.1* 14.8*  LYMPHSABS 2.8  --   --  MONOABS 0.9  --   --   EOSABS 0.5  --   --   BASOSABS 0.1  --   --     Chemistries   Recent Labs Lab 12/12/15 0716 12/13/15 0415 12/14/15 0721  12/15/15 0640  NA 139 143 140 145  K 4.5 3.9 3.6 3.6  CL 102 111 107 110  CO2 28 26 27 27   GLUCOSE 177* 118* 99 72  BUN 28* 22* 11 10  CREATININE 2.07* 1.16 0.95 0.76  CALCIUM 8.3* 7.9* 8.2* 8.7*  AST 19  --   --   --   ALT 11*  --   --   --   ALKPHOS 68  --   --   --   BILITOT 0.7  --   --   --    ------------------------------------------------------------------------------------------------------------------ estimated creatinine clearance is 74 mL/min (by C-G formula based on Cr of 0.76). ------------------------------------------------------------------------------------------------------------------ No results for input(s): HGBA1C in the last 72 hours. ------------------------------------------------------------------------------------------------------------------ No results for input(s): CHOL, HDL, LDLCALC, TRIG, CHOLHDL, LDLDIRECT in the last 72 hours. ------------------------------------------------------------------------------------------------------------------ No results for input(s): TSH, T4TOTAL, T3FREE, THYROIDAB in the last 72 hours.  Invalid input(s): FREET3 ------------------------------------------------------------------------------------------------------------------ No results for input(s): VITAMINB12, FOLATE, FERRITIN, TIBC, IRON, RETICCTPCT in the last 72 hours.  Coagulation profile  Recent Labs Lab 12/12/15 0716  INR 1.10    No results for input(s): DDIMER in the last 72 hours.  Cardiac Enzymes  Recent Labs Lab 12/12/15 0716  TROPONINI <0.03   ------------------------------------------------------------------------------------------------------------------ Invalid input(s): POCBNP    Assessment & Plan   IMPRESSION AND PLAN: Patient is a 82 year old white male recently hospitalized with pneumonia now being admitted with decrease in responsiveness  1. Acute encephalopathy due to sepsis:  Presentation consistent with aspiration pneumonia  continue Unasyn    2. Sepsis due to aspiration pneumonia Continue antibiotics  3. Acute respiratory failure and likely related to recurrent pneumonia.   On a modified diet   4. Hypotension due to sepsis now resolved off pressor  5. COPD with acute exacerbation with wheezing and I will add Solu-Medrol and changes to schedule 6. CODE STATUS DO NOT RESUSCITATE      Code Status Orders        Start     Ordered   12/12/15 0906  Do not attempt resuscitation (DNR)   Continuous    Question Answer Comment  In the event of cardiac or respiratory ARREST Do not call a "code blue"   In the event of cardiac or respiratory ARREST Do not perform Intubation, CPR, defibrillation or ACLS   In the event of cardiac or respiratory ARREST Use medication by any route, position, wound care, and other measures to relive pain and suffering. May use oxygen, suction and manual treatment of airway obstruction as needed for comfort.      12/12/15 0906    Code Status History    Date Active Date Inactive Code Status Order ID Comments User Context   11/29/2015 11:12 PM 12/02/2015  6:25 PM DNR [id number]  Vaughan Basta, MD Inpatient   10/08/2015 10:35 PM 10/12/2015  6:45 PM DNR [id number]  Vaughan Basta, MD Inpatient    Advance Directive Documentation        Most Recent Value   Type of Advance Directive  Out of facility DNR (pink MOST or yellow form)   Pre-existing out of facility DNR order (yellow form or pink MOST form)     "MOST" Form in Place?  Consults none   DVT Prophylaxis   heparin  Lab Results  Component Value Date   PLT 149* 12/15/2015     Time Spent in minutes   70min  tranfer to floor  Dustin Flock M.D on 12/17/2015 at 12:09 PM  Between 7am to 6pm - Pager - [phone]  After 6pm go to [email or web address] - password [email or web address] [email or web address]   Office  [phone]

## 2015-12-17 NOTE — Clinical Social Work Note (Signed)
Pt will return to [facility] when medically stable for discharge. Pt will have Palliative NP following. CSW spoke with pt's son for updated, as well as facility. CSW will continue to follow.   [name], MSW, LCSW Clinical Social Worker  [phone]

## 2015-12-18 LAB — BASIC METABOLIC PANEL
Anion gap: 9 (ref 5–15)
BUN: 8 mg/dL (ref 6–20)
CALCIUM: 8.6 mg/dL — AB (ref 8.9–10.3)
CO2: 34 mmol/L — ABNORMAL HIGH (ref 22–32)
CREATININE: 0.93 mg/dL (ref 0.61–1.24)
Chloride: 103 mmol/L (ref 101–111)
GFR calc non Af Amer: >60 mL/min (ref >60)
Glucose, Bld: 214 mg/dL — ABNORMAL HIGH (ref 65–99)
Potassium: 3.7 mmol/L (ref 3.5–5.1)
SODIUM: 146 mmol/L — AB (ref 135–145)

## 2015-12-18 MED ORDER — PRAVASTATIN SODIUM 20 MG PO TABS
10.0000 mg | ORAL_TABLET | Freq: Every day | ORAL | Status: DC
  Administered 2015-12-18 – 2015-12-19 (×2): 10 mg via ORAL
  Filled 2015-12-18 (×2): qty 1

## 2015-12-18 MED ORDER — TIOTROPIUM BROMIDE MONOHYDRATE 18 MCG IN CAPS
18.0000 ug | ORAL_CAPSULE | Freq: Every day | RESPIRATORY_TRACT | Status: DC
  Administered 2015-12-18 – 2015-12-20 (×3): 18 ug via RESPIRATORY_TRACT
  Filled 2015-12-18: qty 5

## 2015-12-18 MED ORDER — ASPIRIN EC 81 MG PO TBEC
81.0000 mg | DELAYED_RELEASE_TABLET | Freq: Every day | ORAL | Status: DC
  Administered 2015-12-18 – 2015-12-20 (×3): 81 mg via ORAL
  Filled 2015-12-18 (×3): qty 1

## 2015-12-18 MED ORDER — SENNA 8.6 MG PO TABS
1.0000 | ORAL_TABLET | Freq: Every day | ORAL | Status: DC
  Administered 2015-12-18 – 2015-12-20 (×3): 8.6 mg via ORAL
  Filled 2015-12-18 (×3): qty 1

## 2015-12-18 MED ORDER — METOPROLOL TARTRATE 50 MG PO TABS
50.0000 mg | ORAL_TABLET | Freq: Two times a day (BID) | ORAL | Status: DC
  Administered 2015-12-18 – 2015-12-20 (×5): 50 mg via ORAL
  Filled 2015-12-18 (×5): qty 1

## 2015-12-18 MED ORDER — APIXABAN 5 MG PO TABS
5.0000 mg | ORAL_TABLET | Freq: Two times a day (BID) | ORAL | Status: DC
  Administered 2015-12-18 – 2015-12-20 (×5): 5 mg via ORAL
  Filled 2015-12-18 (×5): qty 1

## 2015-12-18 MED ORDER — ALLOPURINOL 100 MG PO TABS
300.0000 mg | ORAL_TABLET | Freq: Every day | ORAL | Status: DC
  Administered 2015-12-18 – 2015-12-20 (×3): 300 mg via ORAL
  Filled 2015-12-18 (×4): qty 3

## 2015-12-18 MED ORDER — LISINOPRIL 20 MG PO TABS
20.0000 mg | ORAL_TABLET | Freq: Every day | ORAL | Status: DC
  Administered 2015-12-18 – 2015-12-20 (×3): 20 mg via ORAL
  Filled 2015-12-18 (×3): qty 1

## 2015-12-18 MED ORDER — GUAIFENESIN ER 600 MG PO TB12: 600.0000 mg | ORAL_TABLET | Freq: Two times a day (BID) | ORAL | Status: DC | PRN

## 2015-12-18 NOTE — Progress Notes (Addendum)
[place] [facility]                                                                                                                                                                                            Patient Demographics   Kirk Hubbard, is a 82 y.o. male, DOB - October 15, 1923, [id number]  Admit date - 12/12/2015   Admitting Physician Dustin Flock, MD  Outpatient Primary MD for the patient is Tracie Harrier, MD   LOS - 6  Subjective: Remains confused. Has advanced dementia. But breathing better.  Review of Systems:   CONSTITUTIONAL Unable to provide  Vitals:   Filed Vitals:   12/17/15 2058 12/18/15 0255 12/18/15 0509 12/18/15 0700  BP: 149/88  139/85   Pulse: 110  101   Temp: 98.3 F (36.8 C)  98.1 F (36.7 C)   TempSrc: Oral  Oral   Resp: 18     Height:      Weight:      SpO2: 94% 92% 91% 90%    Wt Readings from Last 3 Encounters:  12/13/15 110.496 kg (243 lb 9.6 oz)  11/29/15 110.904 kg (244 lb 8 oz)  10/08/15 113.127 kg (249 lb 6.4 oz)     Intake/Output Summary (Last 24 hours) at 12/18/15 0904 Last data filed at 12/17/15 1800  Gross per 24 hour  Intake    240 ml  Output      0 ml  Net    240 ml    Physical Exam:   GENERAL: Chronically ill-appearing male HEAD, EYES, EARS, NOSE AND THROAT: Atraumatic, normocephalic. . Pupils equal and reactive to light. Sclerae anicteric. No conjunctival injection. No oro-pharyngeal erythema.  NECK: Supple. There is no jugular venous distention. No bruits, no lymphadenopathy, no thyromegaly.  HEART: Regular rate and rhythm,. No murmurs, no rubs, no clicks.  LUNGS: Decreased breath sounds bilaterally but no rhonchi.  ABDOMEN: Soft, flat, nontender, nondistended. Has good bowel sounds. No hepatosplenomegaly appreciated.  EXTREMITIES: No evidence of any cyanosis, clubbing, or peripheral edema.  +2 pedal and radial pulses bilaterally.  NEUROLOGIC: The patient is alert, awake,  confused at baseline. no focal motor or sensory deficits appreciated bilaterally.  SKIN: Moist and warm with no rashes appreciated.  Psych: Not anxious, depressed LN: No inguinal LN enlargement    Antibiotics   Anti-infectives    Start     Dose/Rate Route Frequency Ordered Stop   12/14/15 1500  vancomycin (VANCOCIN) 1,250 mg in sodium chloride 0.9 % 250 mL IVPB  Status:  Discontinued     1,250 mg 166.7 mL/hr over 90 Minutes Intravenous Every  24 hours 12/13/15 1550 12/14/15 0844   12/14/15 1200  Ampicillin-Sulbactam (UNASYN) 3 g in sodium chloride 0.9 % 100 mL IVPB     3 g 100 mL/hr over 60 Minutes Intravenous Every 6 hours 12/14/15 1043     12/12/15 2200  piperacillin-tazobactam (ZOSYN) IVPB 3.375 g  Status:  Discontinued     3.375 g 12.5 mL/hr over 240 Minutes Intravenous 3 times per day 12/12/15 0925 12/14/15 1039   12/12/15 1500  vancomycin (VANCOCIN) IVPB 1000 mg/200 mL premix  Status:  Discontinued     1,000 mg 200 mL/hr over 60 Minutes Intravenous Every 24 hours 12/12/15 0949 12/13/15 1550   12/12/15 0915  piperacillin-tazobactam (ZOSYN) IVPB 3.375 g  Status:  Discontinued     3.375 g 12.5 mL/hr over 240 Minutes Intravenous 3 times per day 12/12/15 0907 12/12/15 0925   12/12/15 0730  ceFEPIme (MAXIPIME) 2 g in dextrose 5 % 50 mL IVPB     2 g 100 mL/hr over 30 Minutes Intravenous  Once 12/12/15 0724 12/12/15 1022   12/12/15 0730  vancomycin (VANCOCIN) IVPB 1000 mg/200 mL premix     1,000 mg 200 mL/hr over 60 Minutes Intravenous  Once 12/12/15 0724 12/12/15 1022      Medications   Scheduled Meds: . ampicillin-sulbactam (UNASYN) IV  3 g Intravenous Q6H  . antiseptic oral rinse  7 mL Mouth Rinse BID  . enoxaparin (LOVENOX) injection  40 mg Subcutaneous Q24H  . furosemide  20 mg Intravenous Q12H  . ipratropium-albuterol  3 mL Nebulization Q6H  . methylPREDNISolone (SOLU-MEDROL) injection  60 mg Intravenous Q6H  . sodium chloride flush  3 mL Intravenous Q12H   Continuous  Infusions:   PRN Meds:.acetaminophen, HYDROcodone-acetaminophen, morphine injection, ondansetron **OR** ondansetron (ZOFRAN) IV   Data Review:   Micro Results Recent Results (from the past 240 hour(s))  Blood Culture (routine x 2)     Status: None   Collection Time: 12/12/15  7:16 AM  Result Value Ref Range Status   Specimen Description BLOOD LEFT ANTECUBITAL  Final   Special Requests BOTTLES DRAWN AEROBIC AND ANAEROBIC  1CC  Final   Culture NO GROWTH 5 DAYS  Final   Report Status 12/17/2015 FINAL  Final  Blood Culture (routine x 2)     Status: None   Collection Time: 12/12/15  7:16 AM  Result Value Ref Range Status   Specimen Description BLOOD LEFT HAND  Final   Special Requests BOTTLES DRAWN AEROBIC AND ANAEROBIC  1CC  Final   Culture NO GROWTH 5 DAYS  Final   Report Status 12/17/2015 FINAL  Final  Urine culture     Status: None   Collection Time: 12/12/15  7:39 AM  Result Value Ref Range Status   Specimen Description URINE, RANDOM  Final   Special Requests NONE  Final   Culture NO GROWTH 1 DAY  Final   Report Status 12/13/2015 FINAL  Final  MRSA PCR Screening     Status: None   Collection Time: 12/12/15 10:49 AM  Result Value Ref Range Status   MRSA by PCR NEGATIVE NEGATIVE Final    Comment:        The GeneXpert MRSA Assay (FDA approved for NASAL specimens only), is one component of a comprehensive MRSA colonization surveillance program. It is not intended to diagnose MRSA infection nor to guide or monitor treatment for MRSA infections.     Radiology Reports Dg Chest 1 View  12/17/2015  CLINICAL DATA:  Abnormal chest sounds  EXAM: CHEST 1 VIEW COMPARISON:  12/12/2015 FINDINGS: Mild cardiomegaly. Vascular congestion improved. No pulmonary edema. Lungs are under aerated and grossly clear. Central venous catheter removed. IMPRESSION: Cardiomegaly and improved vascular congestion. Electronically Signed   By: Marybelle Killings M.D.   On: 12/17/2015 12:39   Dg Chest 2  View  12/01/2015  CLINICAL DATA:  82 year old male admitted for respiratory distress. Initial encounter. EXAM: CHEST  2 VIEW COMPARISON:  11/29/2015 and earlier. FINDINGS: Seated AP and lateral views of the chest. Stable cardiomegaly and mediastinal contours. Chronic mild left lung base scarring or atelectasis, but increased left lower lobe opacity compared to 2016. No superimposed pneumothorax, pulmonary edema, or pleural effusion. No acute osseous abnormality identified. IMPRESSION: Confluent left lower lobe opacity suspicious for pneumonia and/or aspiration in this setting. No associated pleural effusion. Electronically Signed   By: Genevie Ann M.D.   On: 12/01/2015 14:48   Ct Head Wo Contrast  12/12/2015  CLINICAL DATA:  Altered mental status, unresponsive EXAM: CT HEAD WITHOUT CONTRAST TECHNIQUE: Contiguous axial images were obtained from the base of the skull through the vertex without contrast. COMPARISON:  11/29/2015 FINDINGS: Similar pattern of diffuse brain atrophy. Mild chronic white matter microvascular ischemic changes about the ventricles. No acute intracranial hemorrhage, mass lesion, definite infarction, mass effect, focal edema, midline shift, herniation, hydrocephalus, or extra-axial fluid collection. Cisterns are patent. Cerebellar atrophy as well. Orbits are symmetric. Mastoids and sinuses remain clear. IMPRESSION: Stable atrophy and chronic white matter microvascular ischemic changes. No interval change or acute process by noncontrast CT. Electronically Signed   By: Jerilynn Mages.  Shick M.D.   On: 12/12/2015 08:11   Ct Head Wo Contrast  11/29/2015  CLINICAL DATA:  Altered mental status since 1 a.m. today EXAM: CT HEAD WITHOUT CONTRAST TECHNIQUE: Contiguous axial images were obtained from the base of the skull through the vertex without intravenous contrast. COMPARISON:  10/16/2009 FINDINGS: Calvarium is intact. Study limited by mild motion artifact. Moderate diffuse atrophy is noted. There is mild  diffuse low attenuation in the deep white matter. No evidence of mass or vascular territory infarct. No hemorrhage or extra-axial fluid. IMPRESSION: Age-related involutional change with no acute findings Electronically Signed   By: Skipper Cliche M.D.   On: 11/29/2015 19:32   Ct Angio Chest Pe W/cm &/or Wo Cm  12/13/2015  CLINICAL DATA:  82 year old male found unresponsive at the rehabilitation facility. Back pain, shortness of breath and recent hospitalization for community-acquired pneumonia. EXAM: CT ANGIOGRAPHY CHEST WITH CONTRAST TECHNIQUE: Multidetector CT imaging of the chest was performed using the standard protocol during bolus administration of intravenous contrast. Multiplanar CT image reconstructions and MIPs were obtained to evaluate the vascular anatomy. CONTRAST:  74mL OMNIPAQUE IOHEXOL 350 MG/ML SOLN COMPARISON:  Chest x-ray 12/12/2015; most recent prior CT scan of the chest 10/08/2015 FINDINGS: Mediastinum: Enlarging right peritracheal adenopathy versus exophytic nodularity arising from the lower pole of the right thyroid gland. The more superior nodule measures 28 x 20 mm compared to 26 x 19 mm previously. A more inferior component has significantly increased in size presently measuring 27 x 20 mm compared to approximately 11 mm previously. The remainder the mediastinum is unremarkable. The thyroid gland appears atrophic. Unremarkable thoracic esophagus. Heart/Vascular: Adequate opacification of the pulmonary arteries to the segmental level. No central filling defect to suggest acute pulmonary embolus. The main pulmonary arteries within normal limits for size. Two vessel aortic arch morphology. The brachiocephalic artery and left common carotid artery share a common origin. Scattered atherosclerotic  vascular calcifications without aneurysmal dilatation or evidence of dissection. Calcified and thickened aortic valve. The heart is within normal limits for size. No pericardial effusion.  Calcifications present throughout the coronary arteries including the left main, left anterior descending, circumflex and right. Lungs/Pleura: Moderate respiratory motion artifact. Patchy airspace opacity in the periphery of the left upper lung into a lesser extent in the right infrahilar region concerning for regions of focal infiltrate. Additionally there is dependent atelectasis in the lower lobes and diffuse mild bronchial wall thickening. Bones/Soft Tissues: No acute fracture or aggressive appearing lytic or blastic osseous lesion. Upper Abdomen: Similar appearance of large bilobed otherwise simple appearing cyst exophytic from the upper pole of the right kidney. Diffuse fatty infiltration of the submucosa of the stomach is similar compared to prior. Colonic diverticulosis involving the splenic flexure of the colon. No acute abnormality. Review of the MIP images confirms the above findings. IMPRESSION: 1. Negative for acute pulmonary embolus. 2. Patchy airspace opacity in the periphery of the left upper lung and right infrahilar region concerning for a multifocal infectious/inflammatory process such as multifocal pneumonia. 3. Enlarging right paratracheal adenopathy versus exophytic nodularity arising from the lower pole of the right thyroid gland. The interval progression is substantial over the short interval since 10/08/2015 raising concern for a malignant neoplastic process. Consider dedicated thyroid ultrasound as the initial workup. The exophytic nodule from the lower pole on the right may be visible sonographically in which case it may be amenable to percutaneous biopsy. 4. Diffuse mild bronchial wall thickening which may reflect chronic bronchitis. 5. Dependent atelectasis. 6. Calcified and thickened aortic valve. Does the patient have a clinical history of aortic stenosis or regurgitation? 7. Left main and 3 vessel coronary artery calcifications. 8. Additional ancillary findings as above.  Electronically Signed   By: Jacqulynn Cadet M.D.   On: 12/13/2015 15:03   Dg Chest Portable 1 View  12/12/2015  CLINICAL DATA:  Central line placement EXAM: PORTABLE CHEST 1 VIEW COMPARISON:  12/12/2015 FINDINGS: Right central line tip in the lower SVC. No pneumothorax. Cardiomegaly with vascular congestion and mild pulmonary edema. Bibasilar atelectasis. IMPRESSION: Right central line placement with the tip in the lower SVC. No pneumothorax. Mild CHF.  Bibasilar atelectasis. Electronically Signed   By: Rolm Baptise M.D.   On: 12/12/2015 09:59   Dg Chest Port 1 View  12/12/2015  CLINICAL DATA:  Increasing respiratory distress EXAM: PORTABLE CHEST 1 VIEW COMPARISON:  12/01/2015 FINDINGS: Cardiac shadow remains enlarged. The overall inspiratory effort is decreased from the prior exam. Stable left basilar atelectatic changes are noted. No focal confluent infiltrate is seen. No bony abnormality is noted. IMPRESSION: Stable left basilar atelectasis. Electronically Signed   By: Inez Catalina M.D.   On: 12/12/2015 07:53   Dg Chest Portable 1 View  11/29/2015  CLINICAL DATA:  82 year old male with altered mental status today. Bilateral leg edema. Cough and congestion. EXAM: PORTABLE CHEST 1 VIEW COMPARISON:  Chest x-ray 10/08/2015. FINDINGS: Lung volumes are slightly low. Bibasilar opacities (left greater than right) may reflect areas of atelectasis and/or consolidation. Possible small left pleural effusion. No right pleural effusion. Mild crowding of the pulmonary vasculature, accentuated by low lung volumes, without frank pulmonary edema. Mild cardiomegaly. Upper mediastinal contours are within normal limits. Atherosclerosis in the thoracic aorta. IMPRESSION: 1. Low lung volumes with bibasilar (left greater than right) areas of atelectasis and/or consolidation, with small left pleural effusion. 2. Atherosclerosis. Electronically Signed   By: Mauri Brooklyn.D.  On: 11/29/2015 18:24     CBC  Recent  Labs Lab 12/12/15 0716 12/13/15 0415 12/15/15 0640  WBC 14.0* 8.8 6.8  HGB 12.4* 11.3* 11.6*  HCT 38.3* 34.3* 35.4*  PLT 212 139* 149*  MCV 92.7 92.9 95.1  MCH 30.0 30.6 31.2  MCHC 32.3 32.9 32.8  RDW 14.9* 15.1* 14.8*  LYMPHSABS 2.8  --   --   MONOABS 0.9  --   --   EOSABS 0.5  --   --   BASOSABS 0.1  --   --     Chemistries   Recent Labs Lab 12/12/15 0716 12/13/15 0415 12/14/15 0721 12/15/15 0640 12/18/15 0526  NA 139 143 140 145 146*  K 4.5 3.9 3.6 3.6 3.7  CL 102 111 107 110 103  CO2 28 26 27 27  34*  GLUCOSE 177* 118* 99 72 214*  BUN 28* 22* 11 10 8   CREATININE 2.07* 1.16 0.95 0.76 0.93  CALCIUM 8.3* 7.9* 8.2* 8.7* 8.6*  AST 19  --   --   --   --   ALT 11*  --   --   --   --   ALKPHOS 68  --   --   --   --   BILITOT 0.7  --   --   --   --    ------------------------------------------------------------------------------------------------------------------ estimated creatinine clearance is 63.6 mL/min (by C-G formula based on Cr of 0.93). ------------------------------------------------------------------------------------------------------------------ No results for input(s): HGBA1C in the last 72 hours. ------------------------------------------------------------------------------------------------------------------ No results for input(s): CHOL, HDL, LDLCALC, TRIG, CHOLHDL, LDLDIRECT in the last 72 hours. ------------------------------------------------------------------------------------------------------------------ No results for input(s): TSH, T4TOTAL, T3FREE, THYROIDAB in the last 72 hours.  Invalid input(s): FREET3 ------------------------------------------------------------------------------------------------------------------ No results for input(s): VITAMINB12, FOLATE, FERRITIN, TIBC, IRON, RETICCTPCT in the last 72 hours.  Coagulation profile  Recent Labs Lab 12/12/15 0716  INR 1.10    No results for input(s): DDIMER in the last 72  hours.  Cardiac Enzymes  Recent Labs Lab 12/12/15 0716  TROPONINI <0.03   ------------------------------------------------------------------------------------------------------------------ Invalid input(s): POCBNP    Assessment & Plan   IMPRESSION AND PLAN: Patient is a 82 year old white male recently hospitalized with pneumonia now being admitted with decrease in responsiveness  1. Acute encephalopathy due to sepsis:  Presentation consistent with aspiration pneumonia continue Unasyn  Patient is awake but has baseline dementia.  2. Sepsis due to aspiration pneumonia Continue antibiotics,continue o2., Continue steroids.;  clinically improving.  3. Acute respiratory failure and likely related to recurrent pneumonia.   On a modified diet   4. Hypotension due to sepsis now resolved off pressor  5. COPD with acute exacerbation; less wheezing. Continue same dose of IV steroids for today and decrease  tomorrow. 6. CODE STATUS DO NOT RESUSCITATE  #7 chronic atrial fibrillation, ;: Resolved hypotension. Now heart rate is up to 111. Restarted the metoprolol, and ELiquis    Code Status Orders        Start     Ordered   12/12/15 0906  Do not attempt resuscitation (DNR)   Continuous    Question Answer Comment  In the event of cardiac or respiratory ARREST Do not call a "code blue"   In the event of cardiac or respiratory ARREST Do not perform Intubation, CPR, defibrillation or ACLS   In the event of cardiac or respiratory ARREST Use medication by any route, position, wound care, and other measures to relive pain and suffering. May use oxygen, suction and manual treatment of airway obstruction as  needed for comfort.      12/12/15 0906    Code Status History    Date Active Date Inactive Code Status Order ID Comments User Context   11/29/2015 11:12 PM 12/02/2015  6:25 PM DNR [id number]  Vaughan Basta, MD Inpatient   10/08/2015 10:35 PM 10/12/2015  6:45 PM DNR [id number]   Vaughan Basta, MD Inpatient    Advance Directive Documentation        Most Recent Value   Type of Advance Directive  Out of facility DNR (pink MOST or yellow form)   Pre-existing out of facility DNR order (yellow form or pink MOST form)     "MOST" Form in Place?             Consults none   DVT Prophylaxis   heparin  Lab Results  Component Value Date   PLT 149* 12/15/2015     Time Spent in minutes   25min  tranfer to floor  Robbi Scurlock M.D on 12/18/2015 at 9:04 AM  Between 7am to 6pm - Pager - [phone]  After 6pm go to [email or web address] - password [email or web address] [facility]   Office  [phone]

## 2015-12-19 ENCOUNTER — Inpatient Hospital Stay: Payer: Medicare Other

## 2015-12-19 LAB — CBC
HCT: 39.1 % — ABNORMAL LOW (ref 40.0–52.0)
Hemoglobin: 12.7 g/dL — ABNORMAL LOW (ref 13.0–18.0)
MCH: 29.7 pg (ref 26.0–34.0)
MCHC: 32.4 g/dL (ref 32.0–36.0)
MCV: 91.4 fL (ref 80.0–100.0)
PLATELETS: 193 10*3/uL (ref 150–440)
RBC: 4.28 MIL/uL — ABNORMAL LOW (ref 4.40–5.90)
RDW: 14.3 % (ref 11.5–14.5)
WBC: 15.2 10*3/uL — ABNORMAL HIGH (ref 3.8–10.6)

## 2015-12-19 LAB — CREATININE, SERUM
CREATININE: 1.06 mg/dL (ref 0.61–1.24)
GFR calc Af Amer: >60 mL/min (ref >60)
GFR calc non Af Amer: 60 mL/min — ABNORMAL LOW (ref >60)

## 2015-12-19 MED ORDER — METHYLPREDNISOLONE SODIUM SUCC 125 MG IJ SOLR
60.0000 mg | INTRAMUSCULAR | Status: DC
  Administered 2015-12-20: 06:00:00 60 mg via INTRAVENOUS
  Filled 2015-12-19: qty 2

## 2015-12-19 NOTE — Progress Notes (Signed)
[place] at [facility]                                                                                                                                                                                            Patient Demographics   Arish Rodrick, is a 82 y.o. male, DOB - [birth year], [id number]  Admit date - 12/12/2015   Admitting Physician Dustin Flock, MD  Outpatient Primary MD for the patient is Tracie Harrier, MD   LOS - 7  Subjective: Patient more awake and alert and able to also questions appropriately than yesterday. Episodes of confusion related dementia still there but for the most part he answered my questions appropriately.   no Cough, no shortness of breath.  Review of Systems:   CONSTITUTIONAL Awake and alert. No headache no double vision. No chest pain or palpitations. No shortness of breath or cough. No abdominal pain or nausea or vomiting. Tolerating the diet. No extremity edema. He is awake, alert.   Vitals:   Filed Vitals:   12/18/15 2057 12/18/15 2140 12/19/15 0501 12/19/15 0726  BP: 130/90  134/81   Pulse: 52 75 76   Temp: 98 F (36.7 C)  97.9 F (36.6 C)   TempSrc: Oral  Oral   Resp: 18  18   Height:      Weight:      SpO2: 93% 94% 90% 92%    Wt Readings from Last 3 Encounters:  12/13/15 110.496 kg (243 lb 9.6 oz)  11/29/15 110.904 kg (244 lb 8 oz)  10/08/15 113.127 kg (249 lb 6.4 oz)     Intake/Output Summary (Last 24 hours) at 12/19/15 1008 Last data filed at 12/18/15 1800  Gross per 24 hour  Intake    240 ml  Output      0 ml  Net    240 ml    Physical Exam:   GENERAL: Chronically ill-appearing male HEAD, EYES, EARS, NOSE AND THROAT: Atraumatic, normocephalic. . Pupils equal and reactive to light. Sclerae anicteric. No conjunctival injection. No oro-pharyngeal erythema.  NECK: Supple. There is no jugular venous distention. No bruits, no lymphadenopathy, no thyromegaly.  HEART: Regular rate  and rhythm,. No murmurs, no rubs, no clicks.  LUNGS:clear to  auscultation bilaterally, no wheezing, no rales, not using accessory muscles of respiration.  ABDOMEN: Soft, flat, nontender, nondistended. Has good bowel sounds. No hepatosplenomegaly appreciated.  EXTREMITIES: No evidence of any cyanosis, clubbing, or peripheral edema.  +2 pedal and radial pulses bilaterally.  NEUROLOGIC: The patient is alert, awake, confused at baseline. no focal motor or sensory deficits appreciated bilaterally.  SKIN:  Moist and warm with no rashes appreciated.  Psych: Not anxious, depressed LN: No inguinal LN enlargement    Antibiotics   Anti-infectives    Start     Dose/Rate Route Frequency Ordered Stop   12/14/15 1500  vancomycin (VANCOCIN) 1,250 mg in sodium chloride 0.9 % 250 mL IVPB  Status:  Discontinued     1,250 mg 166.7 mL/hr over 90 Minutes Intravenous Every 24 hours 12/13/15 1550 12/14/15 0844   12/14/15 1200  Ampicillin-Sulbactam (UNASYN) 3 g in sodium chloride 0.9 % 100 mL IVPB     3 g 100 mL/hr over 60 Minutes Intravenous Every 6 hours 12/14/15 1043     12/12/15 2200  piperacillin-tazobactam (ZOSYN) IVPB 3.375 g  Status:  Discontinued     3.375 g 12.5 mL/hr over 240 Minutes Intravenous 3 times per day 12/12/15 0925 12/14/15 1039   12/12/15 1500  vancomycin (VANCOCIN) IVPB 1000 mg/200 mL premix  Status:  Discontinued     1,000 mg 200 mL/hr over 60 Minutes Intravenous Every 24 hours 12/12/15 0949 12/13/15 1550   12/12/15 0915  piperacillin-tazobactam (ZOSYN) IVPB 3.375 g  Status:  Discontinued     3.375 g 12.5 mL/hr over 240 Minutes Intravenous 3 times per day 12/12/15 0907 12/12/15 0925   12/12/15 0730  ceFEPIme (MAXIPIME) 2 g in dextrose 5 % 50 mL IVPB     2 g 100 mL/hr over 30 Minutes Intravenous  Once 12/12/15 0724 12/12/15 1022   12/12/15 0730  vancomycin (VANCOCIN) IVPB 1000 mg/200 mL premix     1,000 mg 200 mL/hr over 60 Minutes Intravenous  Once 12/12/15 0724 12/12/15 1022       Medications   Scheduled Meds: . allopurinol  300 mg Oral Daily  . ampicillin-sulbactam (UNASYN) IV  3 g Intravenous Q6H  . antiseptic oral rinse  7 mL Mouth Rinse BID  . apixaban  5 mg Oral BID  . aspirin EC  81 mg Oral Daily  . furosemide  20 mg Intravenous Q12H  . ipratropium-albuterol  3 mL Nebulization Q6H  . lisinopril  20 mg Oral Daily  . [START ON 12/20/2015] methylPREDNISolone (SOLU-MEDROL) injection  60 mg Intravenous Q24H  . metoprolol  50 mg Oral BID  . pravastatin  10 mg Oral q1800  . senna  1 tablet Oral Daily  . sodium chloride flush  3 mL Intravenous Q12H  . tiotropium  18 mcg Inhalation Daily   Continuous Infusions:   PRN Meds:.acetaminophen, guaiFENesin, HYDROcodone-acetaminophen, morphine injection, ondansetron **OR** ondansetron (ZOFRAN) IV   Data Review:   Micro Results Recent Results (from the past 240 hour(s))  Blood Culture (routine x 2)     Status: None   Collection Time: 12/12/15  7:16 AM  Result Value Ref Range Status   Specimen Description BLOOD LEFT ANTECUBITAL  Final   Special Requests BOTTLES DRAWN AEROBIC AND ANAEROBIC  1CC  Final   Culture NO GROWTH 5 DAYS  Final   Report Status 12/17/2015 FINAL  Final  Blood Culture (routine x 2)     Status: None   Collection Time: 12/12/15  7:16 AM  Result Value Ref Range Status   Specimen Description BLOOD LEFT HAND  Final   Special Requests BOTTLES DRAWN AEROBIC AND ANAEROBIC  1CC  Final   Culture NO GROWTH 5 DAYS  Final   Report Status 12/17/2015 FINAL  Final  Urine culture     Status: None   Collection Time: 12/12/15  7:39 AM  Result Value Ref Range Status  Specimen Description URINE, RANDOM  Final   Special Requests NONE  Final   Culture NO GROWTH 1 DAY  Final   Report Status 12/13/2015 FINAL  Final  MRSA PCR Screening     Status: None   Collection Time: 12/12/15 10:49 AM  Result Value Ref Range Status   MRSA by PCR NEGATIVE NEGATIVE Final    Comment:        The GeneXpert MRSA Assay  (FDA approved for NASAL specimens only), is one component of a comprehensive MRSA colonization surveillance program. It is not intended to diagnose MRSA infection nor to guide or monitor treatment for MRSA infections.     Radiology Reports Dg Chest 1 View  12/17/2015  CLINICAL DATA:  Abnormal chest sounds EXAM: CHEST 1 VIEW COMPARISON:  12/12/2015 FINDINGS: Mild cardiomegaly. Vascular congestion improved. No pulmonary edema. Lungs are under aerated and grossly clear. Central venous catheter removed. IMPRESSION: Cardiomegaly and improved vascular congestion. Electronically Signed   By: Marybelle Killings M.D.   On: 12/17/2015 12:39   Dg Chest 2 View  12/01/2015  CLINICAL DATA:  82 year old male admitted for respiratory distress. Initial encounter. EXAM: CHEST  2 VIEW COMPARISON:  11/29/2015 and earlier. FINDINGS: Seated AP and lateral views of the chest. Stable cardiomegaly and mediastinal contours. Chronic mild left lung base scarring or atelectasis, but increased left lower lobe opacity compared to 2016. No superimposed pneumothorax, pulmonary edema, or pleural effusion. No acute osseous abnormality identified. IMPRESSION: Confluent left lower lobe opacity suspicious for pneumonia and/or aspiration in this setting. No associated pleural effusion. Electronically Signed   By: Genevie Ann M.D.   On: 12/01/2015 14:48   Ct Head Wo Contrast  12/12/2015  CLINICAL DATA:  Altered mental status, unresponsive EXAM: CT HEAD WITHOUT CONTRAST TECHNIQUE: Contiguous axial images were obtained from the base of the skull through the vertex without contrast. COMPARISON:  11/29/2015 FINDINGS: Similar pattern of diffuse brain atrophy. Mild chronic white matter microvascular ischemic changes about the ventricles. No acute intracranial hemorrhage, mass lesion, definite infarction, mass effect, focal edema, midline shift, herniation, hydrocephalus, or extra-axial fluid collection. Cisterns are patent. Cerebellar atrophy as well.  Orbits are symmetric. Mastoids and sinuses remain clear. IMPRESSION: Stable atrophy and chronic white matter microvascular ischemic changes. No interval change or acute process by noncontrast CT. Electronically Signed   By: Jerilynn Mages.  Shick M.D.   On: 12/12/2015 08:11   Ct Head Wo Contrast  11/29/2015  CLINICAL DATA:  Altered mental status since 1 a.m. today EXAM: CT HEAD WITHOUT CONTRAST TECHNIQUE: Contiguous axial images were obtained from the base of the skull through the vertex without intravenous contrast. COMPARISON:  10/16/2009 FINDINGS: Calvarium is intact. Study limited by mild motion artifact. Moderate diffuse atrophy is noted. There is mild diffuse low attenuation in the deep white matter. No evidence of mass or vascular territory infarct. No hemorrhage or extra-axial fluid. IMPRESSION: Age-related involutional change with no acute findings Electronically Signed   By: Skipper Cliche M.D.   On: 11/29/2015 19:32   Ct Angio Chest Pe W/cm &/or Wo Cm  12/13/2015  CLINICAL DATA:  82 year old male found unresponsive at the rehabilitation facility. Back pain, shortness of breath and recent hospitalization for community-acquired pneumonia. EXAM: CT ANGIOGRAPHY CHEST WITH CONTRAST TECHNIQUE: Multidetector CT imaging of the chest was performed using the standard protocol during bolus administration of intravenous contrast. Multiplanar CT image reconstructions and MIPs were obtained to evaluate the vascular anatomy. CONTRAST:  84mL OMNIPAQUE IOHEXOL 350 MG/ML SOLN COMPARISON:  Chest x-ray  12/12/2015; most recent prior CT scan of the chest 10/08/2015 FINDINGS: Mediastinum: Enlarging right peritracheal adenopathy versus exophytic nodularity arising from the lower pole of the right thyroid gland. The more superior nodule measures 28 x 20 mm compared to 26 x 19 mm previously. A more inferior component has significantly increased in size presently measuring 27 x 20 mm compared to approximately 11 mm previously. The  remainder the mediastinum is unremarkable. The thyroid gland appears atrophic. Unremarkable thoracic esophagus. Heart/Vascular: Adequate opacification of the pulmonary arteries to the segmental level. No central filling defect to suggest acute pulmonary embolus. The main pulmonary arteries within normal limits for size. Two vessel aortic arch morphology. The brachiocephalic artery and left common carotid artery share a common origin. Scattered atherosclerotic vascular calcifications without aneurysmal dilatation or evidence of dissection. Calcified and thickened aortic valve. The heart is within normal limits for size. No pericardial effusion. Calcifications present throughout the coronary arteries including the left main, left anterior descending, circumflex and right. Lungs/Pleura: Moderate respiratory motion artifact. Patchy airspace opacity in the periphery of the left upper lung into a lesser extent in the right infrahilar region concerning for regions of focal infiltrate. Additionally there is dependent atelectasis in the lower lobes and diffuse mild bronchial wall thickening. Bones/Soft Tissues: No acute fracture or aggressive appearing lytic or blastic osseous lesion. Upper Abdomen: Similar appearance of large bilobed otherwise simple appearing cyst exophytic from the upper pole of the right kidney. Diffuse fatty infiltration of the submucosa of the stomach is similar compared to prior. Colonic diverticulosis involving the splenic flexure of the colon. No acute abnormality. Review of the MIP images confirms the above findings. IMPRESSION: 1. Negative for acute pulmonary embolus. 2. Patchy airspace opacity in the periphery of the left upper lung and right infrahilar region concerning for a multifocal infectious/inflammatory process such as multifocal pneumonia. 3. Enlarging right paratracheal adenopathy versus exophytic nodularity arising from the lower pole of the right thyroid gland. The interval  progression is substantial over the short interval since 10/08/2015 raising concern for a malignant neoplastic process. Consider dedicated thyroid ultrasound as the initial workup. The exophytic nodule from the lower pole on the right may be visible sonographically in which case it may be amenable to percutaneous biopsy. 4. Diffuse mild bronchial wall thickening which may reflect chronic bronchitis. 5. Dependent atelectasis. 6. Calcified and thickened aortic valve. Does the patient have a clinical history of aortic stenosis or regurgitation? 7. Left main and 3 vessel coronary artery calcifications. 8. Additional ancillary findings as above. Electronically Signed   By: Jacqulynn Cadet M.D.   On: 12/13/2015 15:03   Dg Chest Portable 1 View  12/12/2015  CLINICAL DATA:  Central line placement EXAM: PORTABLE CHEST 1 VIEW COMPARISON:  12/12/2015 FINDINGS: Right central line tip in the lower SVC. No pneumothorax. Cardiomegaly with vascular congestion and mild pulmonary edema. Bibasilar atelectasis. IMPRESSION: Right central line placement with the tip in the lower SVC. No pneumothorax. Mild CHF.  Bibasilar atelectasis. Electronically Signed   By: Rolm Baptise M.D.   On: 12/12/2015 09:59   Dg Chest Port 1 View  12/12/2015  CLINICAL DATA:  Increasing respiratory distress EXAM: PORTABLE CHEST 1 VIEW COMPARISON:  12/01/2015 FINDINGS: Cardiac shadow remains enlarged. The overall inspiratory effort is decreased from the prior exam. Stable left basilar atelectatic changes are noted. No focal confluent infiltrate is seen. No bony abnormality is noted. IMPRESSION: Stable left basilar atelectasis. Electronically Signed   By: Inez Catalina M.D.   On: 12/12/2015  07:53   Dg Chest Portable 1 View  11/29/2015  CLINICAL DATA:  82 year old male with altered mental status today. Bilateral leg edema. Cough and congestion. EXAM: PORTABLE CHEST 1 VIEW COMPARISON:  Chest x-ray 10/08/2015. FINDINGS: Lung volumes are slightly low.  Bibasilar opacities (left greater than right) may reflect areas of atelectasis and/or consolidation. Possible small left pleural effusion. No right pleural effusion. Mild crowding of the pulmonary vasculature, accentuated by low lung volumes, without frank pulmonary edema. Mild cardiomegaly. Upper mediastinal contours are within normal limits. Atherosclerosis in the thoracic aorta. IMPRESSION: 1. Low lung volumes with bibasilar (left greater than right) areas of atelectasis and/or consolidation, with small left pleural effusion. 2. Atherosclerosis. Electronically Signed   By: Vinnie Langton M.D.   On: 11/29/2015 18:24     CBC  Recent Labs Lab 12/13/15 0415 12/15/15 0640 12/19/15 0603  WBC 8.8 6.8 15.2*  HGB 11.3* 11.6* 12.7*  HCT 34.3* 35.4* 39.1*  PLT 139* 149* 193  MCV 92.9 95.1 91.4  MCH 30.6 31.2 29.7  MCHC 32.9 32.8 32.4  RDW 15.1* 14.8* 14.3    Chemistries   Recent Labs Lab 12/13/15 0415 12/14/15 0721 12/15/15 0640 12/18/15 0526 12/19/15 0603  NA 143 140 145 146*  --   K 3.9 3.6 3.6 3.7  --   CL 111 107 110 103  --   CO2 26 27 27  34*  --   GLUCOSE 118* 99 72 214*  --   BUN 22* 11 10 8   --   CREATININE 1.16 0.95 0.76 0.93 1.06  CALCIUM 7.9* 8.2* 8.7* 8.6*  --    ------------------------------------------------------------------------------------------------------------------ estimated creatinine clearance is 55.8 mL/min (by C-G formula based on Cr of 1.06). ------------------------------------------------------------------------------------------------------------------ No results for input(s): HGBA1C in the last 72 hours. ------------------------------------------------------------------------------------------------------------------ No results for input(s): CHOL, HDL, LDLCALC, TRIG, CHOLHDL, LDLDIRECT in the last 72 hours. ------------------------------------------------------------------------------------------------------------------ No results for input(s):  TSH, T4TOTAL, T3FREE, THYROIDAB in the last 72 hours.  Invalid input(s): FREET3 ------------------------------------------------------------------------------------------------------------------ No results for input(s): VITAMINB12, FOLATE, FERRITIN, TIBC, IRON, RETICCTPCT in the last 72 hours.  Coagulation profile No results for input(s): INR, PROTIME in the last 168 hours.  No results for input(s): DDIMER in the last 72 hours.  Cardiac Enzymes No results for input(s): CKMB, TROPONINI, MYOGLOBIN in the last 168 hours.  Invalid input(s): CK ------------------------------------------------------------------------------------------------------------------ Invalid input(s): POCBNP    Assessment & Plan   IMPRESSION AND PLAN: Patient is a 82 year old white male recently hospitalized with pneumonia now being admitted with decrease in responsiveness  1. Acute encephalopathy due to sepsis:  Presentation consistent with aspiration pneumonia continue Unasyn  Patient is awake but has baseline dementia.  2. Sepsis due to aspiration pneumoniaMultifocal pneumonia on the CAT scan of the chest. Clinically improving. Less wheezing. No fever.  Continue antibiotics,continue o2., Decrease IV Solu-Medrol.  clinically improving. Elevated white count is secondary to steroids.   3. Acute respiratory failure and likely related to recurrent pneumonia.   On a modified diet , Continue oxygen to keep sats more than 90%. He is on 2 L of oxygen at this time.  4. Hypotension due to sepsis now resolved off pressor  5. COPD with acute exacerbation; lungs are clear. I have decreased the IV Solu-Medrol.   6. CODE STATUS DO NOT RESUSCITATE   #7 chronic atrial fibrillation, ;: Resolved hypotension. Now heart rate is up to 111. Restarted the metoprolol, and ELiquis 8. Right  Thyroid mass; was seen on the CT of the chest: We will order ultrasound  of the thyroid to evaluate for right thyroid mass.    Code Status  Orders        Start     Ordered   12/12/15 0906  Do not attempt resuscitation (DNR)   Continuous    Question Answer Comment  In the event of cardiac or respiratory ARREST Do not call a "code blue"   In the event of cardiac or respiratory ARREST Do not perform Intubation, CPR, defibrillation or ACLS   In the event of cardiac or respiratory ARREST Use medication by any route, position, wound care, and other measures to relive pain and suffering. May use oxygen, suction and manual treatment of airway obstruction as needed for comfort.      12/12/15 0906    Code Status History    Date Active Date Inactive Code Status Order ID Comments User Context   11/29/2015 11:12 PM 12/02/2015  6:25 PM DNR [id number]  Vaughan Basta, MD Inpatient   10/08/2015 10:35 PM 10/12/2015  6:45 PM DNR [id number]  Vaughan Basta, MD Inpatient    Advance Directive Documentation        Most Recent Value   Type of Advance Directive  Out of facility DNR (pink MOST or yellow form)   Pre-existing out of facility DNR order (yellow form or pink MOST form)     "MOST" Form in Place?             Consults none   DVT Prophylaxis   heparin  Lab Results  Component Value Date   PLT 193 12/19/2015     Time Spent in minutes   80min  tranfer to floor  Nanie Dunkleberger M.D on 12/19/2015 at 10:08 AM  Between 7am to 6pm - Pager - [phone]  After 6pm go to [email or web address] - password [email or web address] [facility]   Office  [phone]

## 2015-12-19 NOTE — Progress Notes (Signed)
Patient son at bedside stating that the left hearing aid is missing and was present upon admission. Profile documentation states that patient only had one hearing aid and the other was left at [facility]. Patient was wearing left hearing aid in right ear this shift. Right hearing aid is now in place. Madlyn Frankel, RN

## 2015-12-20 MED ORDER — PREDNISONE 10 MG (21) PO TBPK: 10.0000 mg | ORAL_TABLET | Freq: Every day | ORAL | Status: AC

## 2015-12-20 MED ORDER — LEVOFLOXACIN 500 MG PO TABS: 500.0000 mg | ORAL_TABLET | Freq: Every day | ORAL | Status: AC

## 2015-12-20 NOTE — Care Management Important Message (Signed)
Important Message  Patient Details  Name: Kirk Hubbard MRN: [id number] Date of Birth: [birth year]   Medicare Important Message Given:  Yes    Juliann Pulse A Anett Ranker 12/20/2015, 1:21 PM

## 2015-12-20 NOTE — Discharge Instructions (Signed)
Dysphagia 3 diet with thin liquids, no straws ,chopped meat,

## 2015-12-20 NOTE — Progress Notes (Signed)
Pt to be discharged to [facility] via ems . Alert.  02 2l [place].  Report called to [employer] at [place]. Ems called to transport pt . Sl d/cd.

## 2015-12-20 NOTE — Clinical Social Work Note (Signed)
Pt is ready for discharge today to [facility]. Pt's son, [name], is aware and agreeable to discharge plan. [facility] and is ready to accept pt as they have received discharge discharge information. RN to call report and EMS will provide transportation. CSW is signing off as no further needs identified.   [name], MSW, LCSW Clinical Social Worker  [phone]

## 2015-12-20 NOTE — Discharge Summary (Signed)
Kirk Hubbard, is a 82 y.o. male  DOB 06-08-1923  MRN [id number].  Admission date:  12/12/2015  Admitting Physician  Dustin Flock, MD  Discharge Date:  12/20/2015   Primary MD  Tracie Harrier, MD  Recommendations for primary care physician for things to follow:  Follow-up with primary doctor in 1 week   Admission Diagnosis  Sepsis, due to unspecified organism [facility]) [A41.9] Acute renal failure, unspecified acute renal failure type ([place]) [N17.9]   Discharge Diagnosis  Sepsis, due to unspecified organism [facility]) [A41.9] Acute renal failure, unspecified acute renal failure type ([place]) [N17.9]    Active Problems:   Sepsis [facility])      Past Medical History  Diagnosis Date  . Hypertension   . Hyperlipidemia   . DJD (degenerative joint disease)   . History of gout   . History of kidney stones   . History of prostate cancer   . Renal disorder     kidney stones  . CHF (congestive heart failure) ([place])   . COPD (chronic obstructive pulmonary disease) ([place])   . DVT (deep venous thrombosis) ([place]) sept 2016  . Cancer [facility])     Past Surgical History  Procedure Laterality Date  . Kidney stone surgery         History of present illness and  Hospital Course:     Kindly see H&P for history of present illness and admission details, please review complete Labs, Consult reports and Test reports for all details in brief  HPI  from the history and physical done on the day of admission  82 year old male patient with hypertension, hyperlipidemia, COPD on 2 years oxygen, DVT or atelectasis brought in by family because of altered mental status, cough and sputum production. Patient found to have bilateral pneumonia, elevated white count, admitted for sepsis secondary to pneumonia, COPD exacerbation.  Hospital Course  #1. Metabolic  encephalopathy secondary to underlying infection with sepsis and also due to dementia: CT of the head unremarkable. Mental status is back to normal after sepsis symptoms improved. Has underlying dementia. #2. Sepsis present on admission due to pneumonia: lactic acid 2.9 and admission. He was hypotensive with blood pressure 60/30 on admission. Patient nausea recently in the hospital and discharge to rehabilitation with Augmentin. Patient WBC 14,000 on admission, and received IV vancomycin, Zosyn, initially. Later on changed to Unasyn for aspiration pneumonia. Blood cultures did not show any growth. Patient course status is DO NOT RESUSCITATE. Patient seen by speech therapy, had a modified barium swallow study., Usually kept nothing by mouth  because of altered mental status, patient had MBS by speech therapy, started on dysphagia 3 diet with gravy to chop meats, no straws and thin liquids. Patient is tolerating the diet. CBC normalized to 6.8.  #2 altered mental status with the encephalopathy secondary to sepsis: Improved. Patient back to baseline with baseline dementia. #3 acute renal failure secondary to sepsis: Improved with IV hydration. #4. History of left leg DVT: And his atelectasis, continued that. #5 COPD exacerbation: Patient received IV Solu-Medrol, lungs are clear at this time with no wheezing. Discharged to [facility] with tapering course of prednisone, Levaquin to finish the course for pneumonia. #6 Acute on chronic respiratory failure secondary to pneumonia: Improved, back to baseline, continue oxygen 2 L.  7 .Chronic atrial fibrillation: Patient started back on metoprolol, continue lEliquis 8. abNormal CT of the chest: CT of the chest on admission showed lobar pneumonia in the left and right lungs. It also showed possible nodule in the right thyroid. Because of that we ordered ultrasound of thyroid, it showed no nodules in the right or left thyroid, right paratracheal lesion favoring the  lymph node worrisome for possible nodal metastases secondary to primary bronchogenic neoplasm, patient needs either bronchoscopy or outpatient PET scan CT for further evaluation. I will relay this information to Dr.Hande PCP,to arrange outpatient follow-up for him.   Discharge Condition: Stable   Follow UP   follow up with Dr. Marcello Fennel  in 1 week.   Discharge Instructions  and  Discharge Medications        Medication List    STOP taking these medications        amoxicillin-clavulanate 875-125 MG tablet  Commonly known as:  AUGMENTIN      TAKE these medications        allopurinol 300 MG tablet  Commonly known as:  ZYLOPRIM  Take 300 mg by mouth daily.     aspirin EC 81 MG tablet  Take 81 mg by mouth daily.     budesonide-formoterol 160-4.5 MCG/ACT inhaler  Commonly known as:  SYMBICORT  Inhale 2 puffs into the lungs 2 (two) times daily.     ELIQUIS 5 MG Tabs tablet  Generic drug:  apixaban  Take 5 mg by mouth 2 (two) times daily.     furosemide 40 MG tablet  Commonly known as:  LASIX  Take 40 mg by mouth daily.     guaiFENesin 600 MG 12 hr tablet  Commonly known as:  MUCINEX  Take 1 tablet (600 mg total) by mouth 2 (two) times daily.     guaiFENesin 600 MG 12 hr tablet  Commonly known as:  MUCINEX  Take 600 mg by mouth 2 (two) times daily as needed for cough.     INCRUSE ELLIPTA 62.5 MCG/INH Aepb  Generic drug:  Umeclidinium Bromide  Inhale 1 puff into the lungs daily. *Wait 5 minutes between inhalers*     ipratropium-albuterol 0.5-2.5 (3) MG/3ML Soln  Commonly known as:  DUONEB  Take 3 mLs by nebulization 4 (four) times daily.     levofloxacin 500 MG tablet  Commonly known as:  LEVAQUIN  Take 1 tablet (500 mg total) by mouth daily.     lisinopril 20 MG tablet  Commonly known as:  PRINIVIL,ZESTRIL  Take 20 mg by mouth daily.     lovastatin 20 MG tablet  Commonly known as:  MEVACOR  Take 20 mg by mouth at bedtime.     metoprolol 50 MG tablet   Commonly known  as:  LOPRESSOR  Take 50 mg by mouth 2 (two) times daily.     nystatin 100000 UNIT/GM Powd  Apply twice a day- in groin and under abdominal folds     omeprazole 20 MG capsule  Commonly known as:  PRILOSEC  Take 20 mg by mouth daily.     predniSONE 10 MG (21) Tbpk tablet  Commonly known as:  STERAPRED UNI-PAK 21 TAB  Take 1 tablet (10 mg total) by mouth daily. Start 50 mg po daily  And taper by 10 mg daily     senna 8.6 MG Tabs tablet  Commonly known as:  SENOKOT  Take 1 tablet (8.6 mg total) by mouth daily.     SENNA-S 8.6-50 MG tablet  Generic drug:  senna-docusate  Take 2 tablets by mouth at bedtime. Hold for loose stools *note dose*     Spirometer Kit  1 each by Does not apply route 4 (four) times daily. Use while awake     tiotropium 18 MCG inhalation capsule  Commonly known as:  SPIRIVA  Place 1 capsule (18 mcg total) into inhaler and inhale daily.          Diet and Activity recommendation: See Discharge Instructions above   Consults obtained - speech physical therapy Major procedures and Radiology Reports - PLEASE review detailed and final reports for all details, in brief -      Dg Chest 1 View  12/17/2015  CLINICAL DATA:  Abnormal chest sounds EXAM: CHEST 1 VIEW COMPARISON:  12/12/2015 FINDINGS: Mild cardiomegaly. Vascular congestion improved. No pulmonary edema. Lungs are under aerated and grossly clear. Central venous catheter removed. IMPRESSION: Cardiomegaly and improved vascular congestion. Electronically Signed   By: Marybelle Killings M.D.   On: 12/17/2015 12:39   Dg Chest 2 View  12/01/2015  CLINICAL DATA:  82 year old male admitted for respiratory distress. Initial encounter. EXAM: CHEST  2 VIEW COMPARISON:  11/29/2015 and earlier. FINDINGS: Seated AP and lateral views of the chest. Stable cardiomegaly and mediastinal contours. Chronic mild left lung base scarring or atelectasis, but increased left lower lobe opacity compared to 2016. No  superimposed pneumothorax, pulmonary edema, or pleural effusion. No acute osseous abnormality identified. IMPRESSION: Confluent left lower lobe opacity suspicious for pneumonia and/or aspiration in this setting. No associated pleural effusion. Electronically Signed   By: Genevie Ann M.D.   On: 12/01/2015 14:48   Ct Head Wo Contrast  12/12/2015  CLINICAL DATA:  Altered mental status, unresponsive EXAM: CT HEAD WITHOUT CONTRAST TECHNIQUE: Contiguous axial images were obtained from the base of the skull through the vertex without contrast. COMPARISON:  11/29/2015 FINDINGS: Similar pattern of diffuse brain atrophy. Mild chronic white matter microvascular ischemic changes about the ventricles. No acute intracranial hemorrhage, mass lesion, definite infarction, mass effect, focal edema, midline shift, herniation, hydrocephalus, or extra-axial fluid collection. Cisterns are patent. Cerebellar atrophy as well. Orbits are symmetric. Mastoids and sinuses remain clear. IMPRESSION: Stable atrophy and chronic white matter microvascular ischemic changes. No interval change or acute process by noncontrast CT. Electronically Signed   By: Jerilynn Mages.  Shick M.D.   On: 12/12/2015 08:11   Ct Head Wo Contrast  11/29/2015  CLINICAL DATA:  Altered mental status since 1 a.m. today EXAM: CT HEAD WITHOUT CONTRAST TECHNIQUE: Contiguous axial images were obtained from the base of the skull through the vertex without intravenous contrast. COMPARISON:  10/16/2009 FINDINGS: Calvarium is intact. Study limited by mild motion artifact. Moderate diffuse atrophy is noted. There is mild diffuse low  attenuation in the deep white matter. No evidence of mass or vascular territory infarct. No hemorrhage or extra-axial fluid. IMPRESSION: Age-related involutional change with no acute findings Electronically Signed   By: Skipper Cliche M.D.   On: 11/29/2015 19:32   Ct Angio Chest Pe W/cm &/or Wo Cm  12/13/2015  CLINICAL DATA:  82 year old male found  unresponsive at the rehabilitation facility. Back pain, shortness of breath and recent hospitalization for community-acquired pneumonia. EXAM: CT ANGIOGRAPHY CHEST WITH CONTRAST TECHNIQUE: Multidetector CT imaging of the chest was performed using the standard protocol during bolus administration of intravenous contrast. Multiplanar CT image reconstructions and MIPs were obtained to evaluate the vascular anatomy. CONTRAST:  90m OMNIPAQUE IOHEXOL 350 MG/ML SOLN COMPARISON:  Chest x-ray 12/12/2015; most recent prior CT scan of the chest 10/08/2015 FINDINGS: Mediastinum: Enlarging right peritracheal adenopathy versus exophytic nodularity arising from the lower pole of the right thyroid gland. The more superior nodule measures 28 x 20 mm compared to 26 x 19 mm previously. A more inferior component has significantly increased in size presently measuring 27 x 20 mm compared to approximately 11 mm previously. The remainder the mediastinum is unremarkable. The thyroid gland appears atrophic. Unremarkable thoracic esophagus. Heart/Vascular: Adequate opacification of the pulmonary arteries to the segmental level. No central filling defect to suggest acute pulmonary embolus. The main pulmonary arteries within normal limits for size. Two vessel aortic arch morphology. The brachiocephalic artery and left common carotid artery share a common origin. Scattered atherosclerotic vascular calcifications without aneurysmal dilatation or evidence of dissection. Calcified and thickened aortic valve. The heart is within normal limits for size. No pericardial effusion. Calcifications present throughout the coronary arteries including the left main, left anterior descending, circumflex and right. Lungs/Pleura: Moderate respiratory motion artifact. Patchy airspace opacity in the periphery of the left upper lung into a lesser extent in the right infrahilar region concerning for regions of focal infiltrate. Additionally there is dependent  atelectasis in the lower lobes and diffuse mild bronchial wall thickening. Bones/Soft Tissues: No acute fracture or aggressive appearing lytic or blastic osseous lesion. Upper Abdomen: Similar appearance of large bilobed otherwise simple appearing cyst exophytic from the upper pole of the right kidney. Diffuse fatty infiltration of the submucosa of the stomach is similar compared to prior. Colonic diverticulosis involving the splenic flexure of the colon. No acute abnormality. Review of the MIP images confirms the above findings. IMPRESSION: 1. Negative for acute pulmonary embolus. 2. Patchy airspace opacity in the periphery of the left upper lung and right infrahilar region concerning for a multifocal infectious/inflammatory process such as multifocal pneumonia. 3. Enlarging right paratracheal adenopathy versus exophytic nodularity arising from the lower pole of the right thyroid gland. The interval progression is substantial over the short interval since 10/08/2015 raising concern for a malignant neoplastic process. Consider dedicated thyroid ultrasound as the initial workup. The exophytic nodule from the lower pole on the right may be visible sonographically in which case it may be amenable to percutaneous biopsy. 4. Diffuse mild bronchial wall thickening which may reflect chronic bronchitis. 5. Dependent atelectasis. 6. Calcified and thickened aortic valve. Does the patient have a clinical history of aortic stenosis or regurgitation? 7. Left main and 3 vessel coronary artery calcifications. 8. Additional ancillary findings as above. Electronically Signed   By: HJacqulynn CadetM.D.   On: 12/13/2015 15:03   [place] Tissue Head/neck  12/19/2015  CLINICAL DATA:  Exophytic thyroid nodule versus right paratracheal lymph node on CT EXAM: THYROID ULTRASOUND TECHNIQUE: Ultrasound  examination of the thyroid gland and adjacent soft tissues was performed. COMPARISON:  None. FINDINGS: Limited evaluation due to body  habitus and inpatient setting. Right thyroid lobe Measurements: 2.6 x 1.5 x 1.4 cm.  No nodules visualized. Left thyroid lobe Measurements: 2.2 x 1.1 x 1.1 cm.  No nodules visualized. Isthmus Thickness: 4 mm.  No nodules visualized. Lymphadenopathy 2.3 x 1.6 x 1.3 cm right paratracheal lesion (image 37) appears separate/distinct from the right thyroid gland, favoring a lymph node, although this is poorly visualized on this inpatient ultrasound. IMPRESSION: 2.3 x 1.6 x 1.3 cm right paratracheal lesion appears separate/distinct from the right thyroid gland, favoring a lymph node, although poorly visualized on inpatient ultrasound. In the setting of persistent nodular opacities in the posterior left upper lobe and right perihilar region on multiple prior CTs, this appearance is worrisome for nodal metastasis secondary to primary bronchogenic neoplasm. Consider bronchoscopy, percutaneous sampling, or outpatient PET-CT for further evaluation. Electronically Signed   By: Julian Hy M.D.   On: 12/19/2015 17:52   Dg Chest Portable 1 View  12/12/2015  CLINICAL DATA:  Central line placement EXAM: PORTABLE CHEST 1 VIEW COMPARISON:  12/12/2015 FINDINGS: Right central line tip in the lower SVC. No pneumothorax. Cardiomegaly with vascular congestion and mild pulmonary edema. Bibasilar atelectasis. IMPRESSION: Right central line placement with the tip in the lower SVC. No pneumothorax. Mild CHF.  Bibasilar atelectasis. Electronically Signed   By: Rolm Baptise M.D.   On: 12/12/2015 09:59   Dg Chest Port 1 View  12/12/2015  CLINICAL DATA:  Increasing respiratory distress EXAM: PORTABLE CHEST 1 VIEW COMPARISON:  12/01/2015 FINDINGS: Cardiac shadow remains enlarged. The overall inspiratory effort is decreased from the prior exam. Stable left basilar atelectatic changes are noted. No focal confluent infiltrate is seen. No bony abnormality is noted. IMPRESSION: Stable left basilar atelectasis. Electronically Signed   By:  Inez Catalina M.D.   On: 12/12/2015 07:53   Dg Chest Portable 1 View  11/29/2015  CLINICAL DATA:  82 year old male with altered mental status today. Bilateral leg edema. Cough and congestion. EXAM: PORTABLE CHEST 1 VIEW COMPARISON:  Chest x-ray 10/08/2015. FINDINGS: Lung volumes are slightly low. Bibasilar opacities (left greater than right) may reflect areas of atelectasis and/or consolidation. Possible small left pleural effusion. No right pleural effusion. Mild crowding of the pulmonary vasculature, accentuated by low lung volumes, without frank pulmonary edema. Mild cardiomegaly. Upper mediastinal contours are within normal limits. Atherosclerosis in the thoracic aorta. IMPRESSION: 1. Low lung volumes with bibasilar (left greater than right) areas of atelectasis and/or consolidation, with small left pleural effusion. 2. Atherosclerosis. Electronically Signed   By: Vinnie Langton M.D.   On: 11/29/2015 18:24    Micro Results    Recent Results (from the past 240 hour(s))  Blood Culture (routine x 2)     Status: None   Collection Time: 12/12/15  7:16 AM  Result Value Ref Range Status   Specimen Description BLOOD LEFT ANTECUBITAL  Final   Special Requests BOTTLES DRAWN AEROBIC AND ANAEROBIC  1CC  Final   Culture NO GROWTH 5 DAYS  Final   Report Status 12/17/2015 FINAL  Final  Blood Culture (routine x 2)     Status: None   Collection Time: 12/12/15  7:16 AM  Result Value Ref Range Status   Specimen Description BLOOD LEFT HAND  Final   Special Requests BOTTLES DRAWN AEROBIC AND ANAEROBIC  1CC  Final   Culture NO GROWTH 5 DAYS  Final  Report Status 12/17/2015 FINAL  Final  Urine culture     Status: None   Collection Time: 12/12/15  7:39 AM  Result Value Ref Range Status   Specimen Description URINE, RANDOM  Final   Special Requests NONE  Final   Culture NO GROWTH 1 DAY  Final   Report Status 12/13/2015 FINAL  Final  MRSA PCR Screening     Status: None   Collection Time: 12/12/15 10:49  AM  Result Value Ref Range Status   MRSA by PCR NEGATIVE NEGATIVE Final    Comment:        The GeneXpert MRSA Assay (FDA approved for NASAL specimens only), is one component of a comprehensive MRSA colonization surveillance program. It is not intended to diagnose MRSA infection nor to guide or monitor treatment for MRSA infections.        Today   Subjective:   Kirk Hubbard today has baseline dementia but otherwise stable, no cough, no shortness of breath appears comfortable. Stable to go to [facility] today.  Objective:   Blood pressure 116/74, pulse 92, temperature 98.4 F (36.9 C), temperature source Oral, resp. rate 18, height '5\' 8"'$  (1.727 m), weight 110.496 kg (243 lb 9.6 oz), SpO2 90 %.   Intake/Output Summary (Last 24 hours) at 12/20/15 0815 Last data filed at 12/19/15 1832  Gross per 24 hour  Intake     99 ml  Output      0 ml  Net     99 ml    Exam Awake Alert,  No new F.N deficits, Normal affect [place].AT,PERRAL Supple Neck,No JVD, No cervical lymphadenopathy appriciated.  Symmetrical Chest wall movement, Good air movement bilaterally, CTAB RRR,No Gallops,Rubs or new Murmurs, No Parasternal Heave +ve B.Sounds, Abd Soft, Non tender, No organomegaly appriciated, No rebound -guarding or rigidity. No Cyanosis, Clubbing or edema, No new Rash or bruise  Data Review   CBC w Diff: Lab Results  Component Value Date   WBC 15.2* 12/19/2015   HGB 12.7* 12/19/2015   HCT 39.1* 12/19/2015   PLT 193 12/19/2015   LYMPHOPCT 20 12/12/2015   MONOPCT 6 12/12/2015   EOSPCT 3 12/12/2015   BASOPCT 1 12/12/2015    CMP: Lab Results  Component Value Date   NA 146* 12/18/2015   K 3.7 12/18/2015   CL 103 12/18/2015   CO2 34* 12/18/2015   BUN 8 12/18/2015   CREATININE 1.06 12/19/2015   PROT 6.3* 12/12/2015   ALBUMIN 3.2* 12/12/2015   BILITOT 0.7 12/12/2015   ALKPHOS 68 12/12/2015   AST 19 12/12/2015   ALT 11* 12/12/2015  .   Total Time in preparing paper work,  data evaluation and todays exam - 31 minutes  Valoree Agent M.D on 12/20/2015 at 8:15 AM    Note: This dictation was prepared with Dragon dictation along with smaller phrase technology. Any transcriptional errors that result from this process are unintentional.

## 2015-12-20 NOTE — Progress Notes (Signed)
Ems here to pick pt up for  Transport to  wom.

## 2016-01-13 ENCOUNTER — Ambulatory Visit: Payer: Medicare Other | Admitting: Cardiothoracic Surgery

## 2016-01-13 ENCOUNTER — Ambulatory Visit: Payer: Medicare Other

## 2016-02-12 DEATH — deceased

## 2016-02-23 ENCOUNTER — Other Ambulatory Visit: Payer: Self-pay | Admitting: Internal Medicine

## 2016-02-23 DIAGNOSIS — R911 Solitary pulmonary nodule: Secondary | ICD-10-CM

## 2016-02-28 ENCOUNTER — Ambulatory Visit: Admit: 2016-02-28 | Payer: Medicare Other

## 2017-08-01 IMAGING — US US SOFT TISSUE HEAD/NECK
1 series · 13 of 25 positions shown · non-contrast
Comparison: None.

CLINICAL DATA: Exophytic thyroid nodule versus right paratracheal
lymph node on CT

EXAM:
THYROID ULTRASOUND
TECHNIQUE: Ultrasound examination of the thyroid gland and adjacent soft
tissues was performed.

[Series 1: us soft tissue head/neck · 0.09mm/px · 13 of 40 slices shown]
[im 1/40]
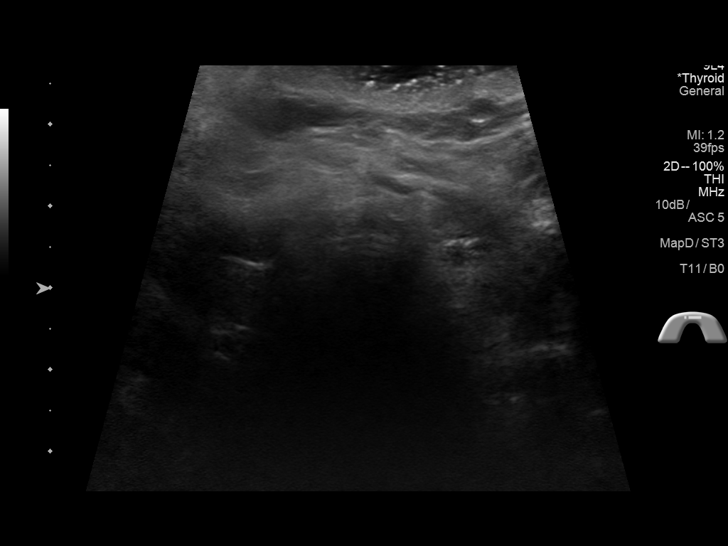
[im 4/40]
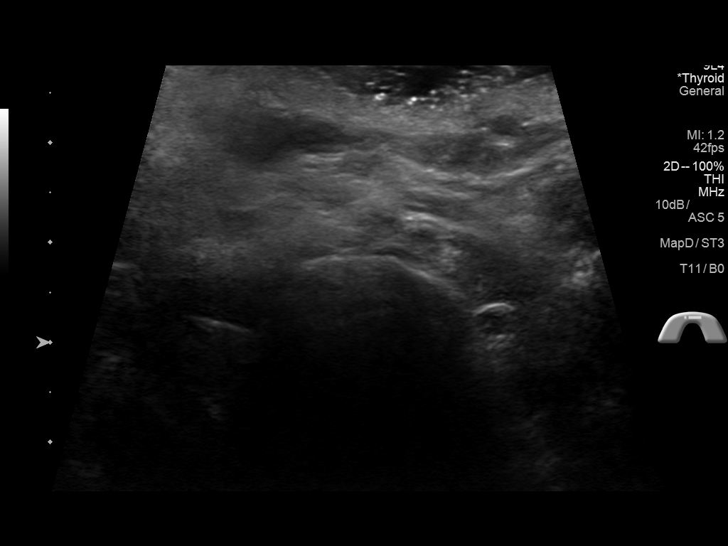
[im 7/40]
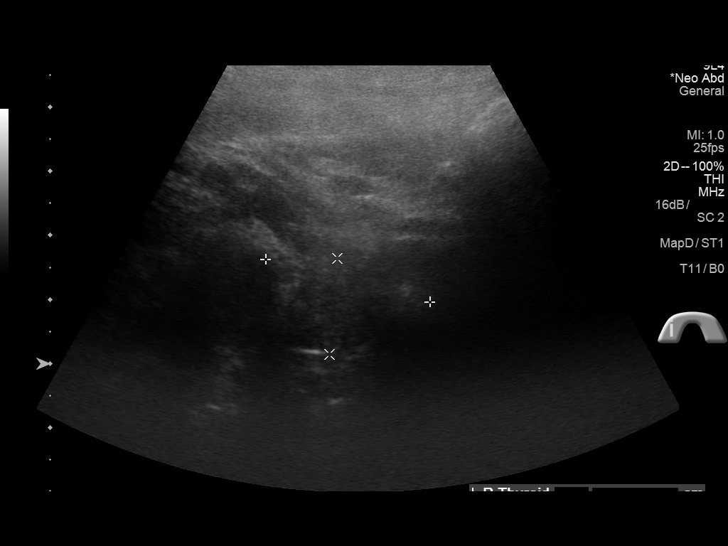
[im 10/40]
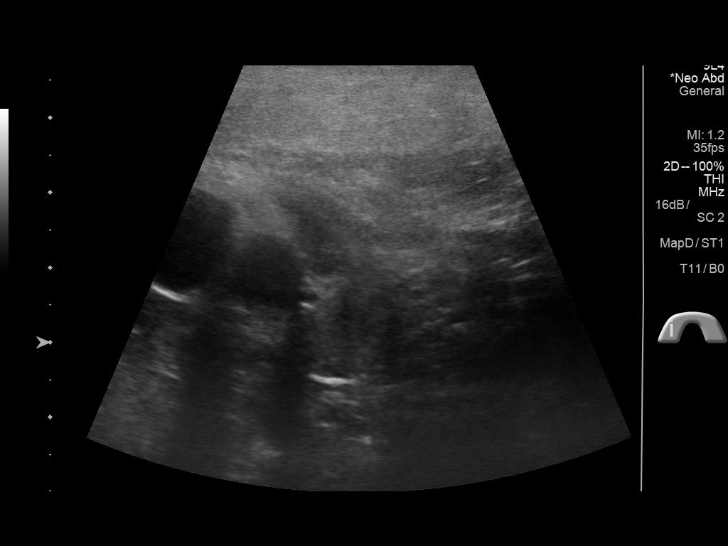
[im 14/40]
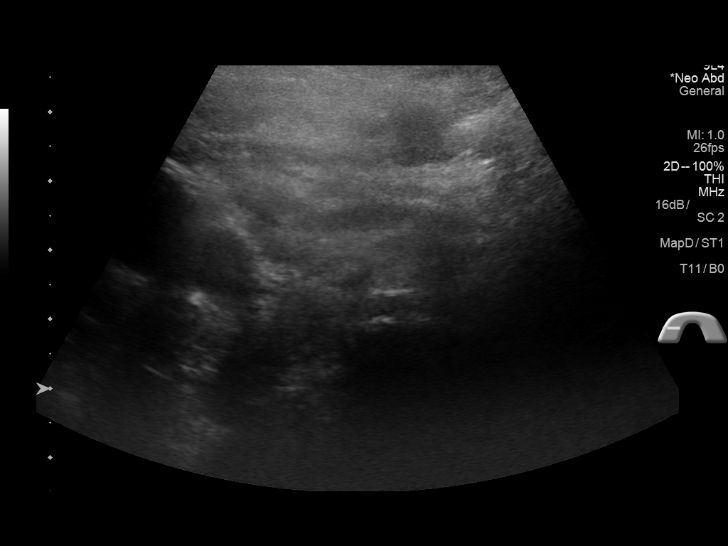
[im 17/40]
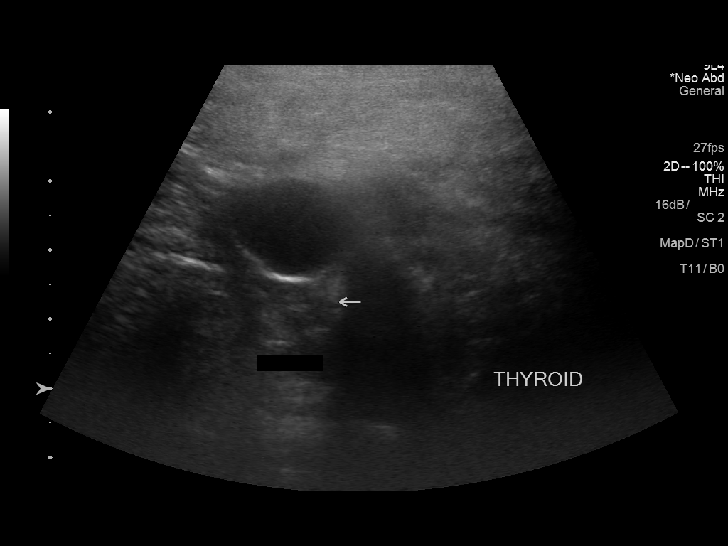
[im 20/40]
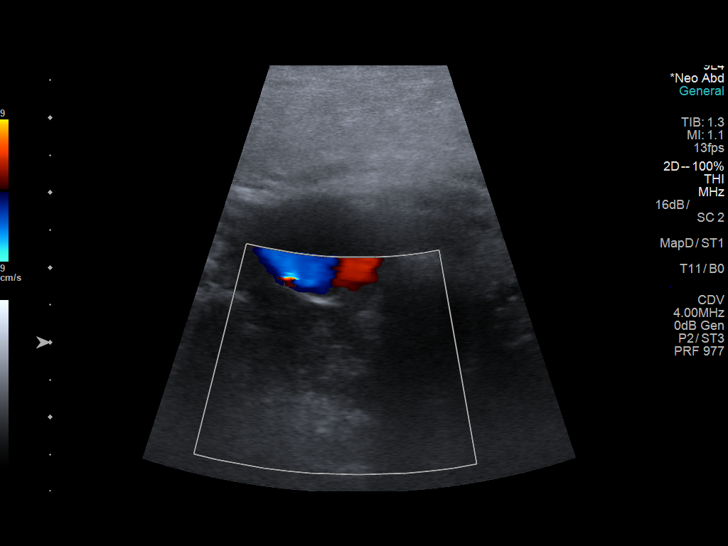
[im 23/40]
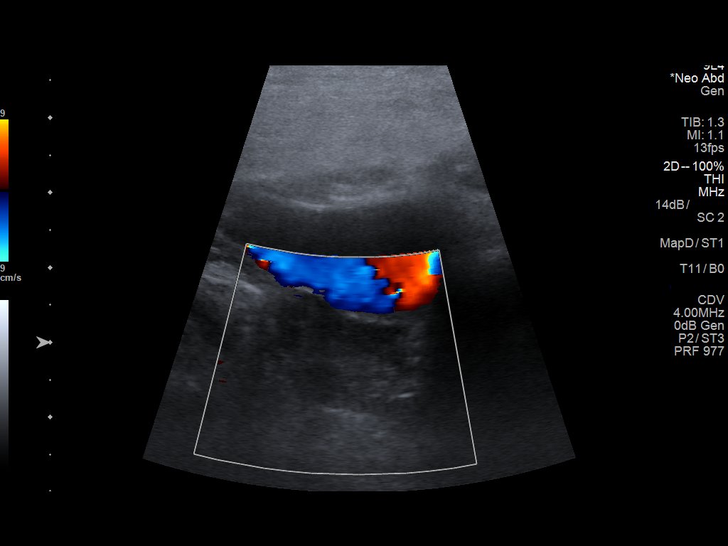
[im 27/40]
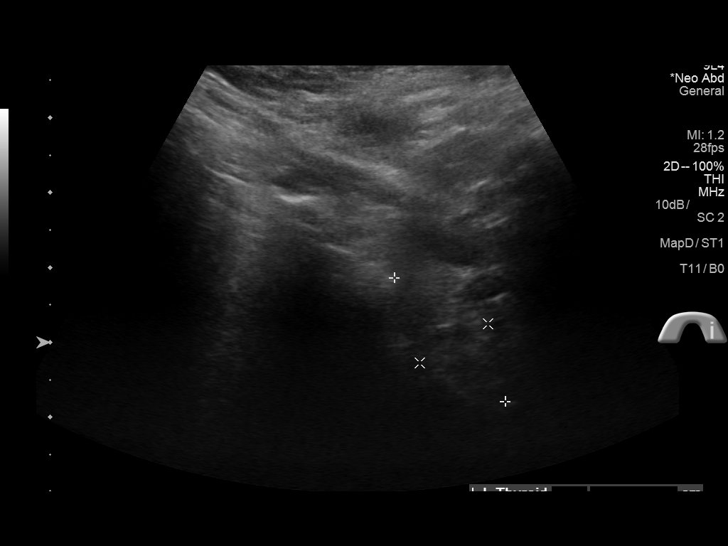
[im 30/40]
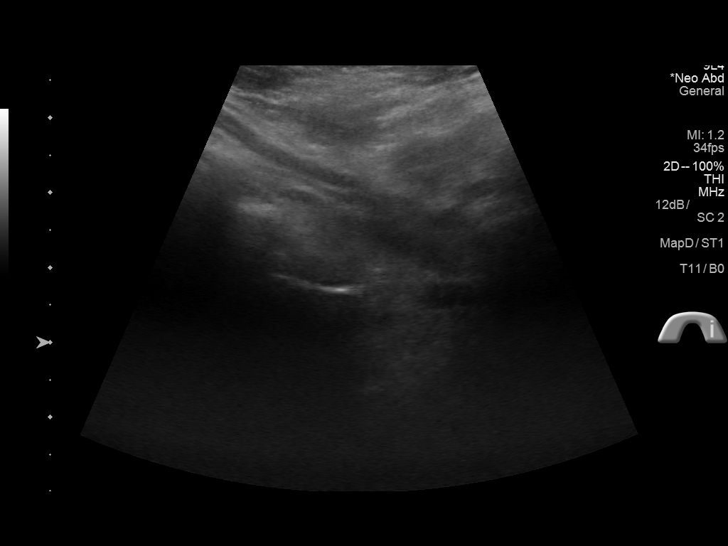
[im 33/40]
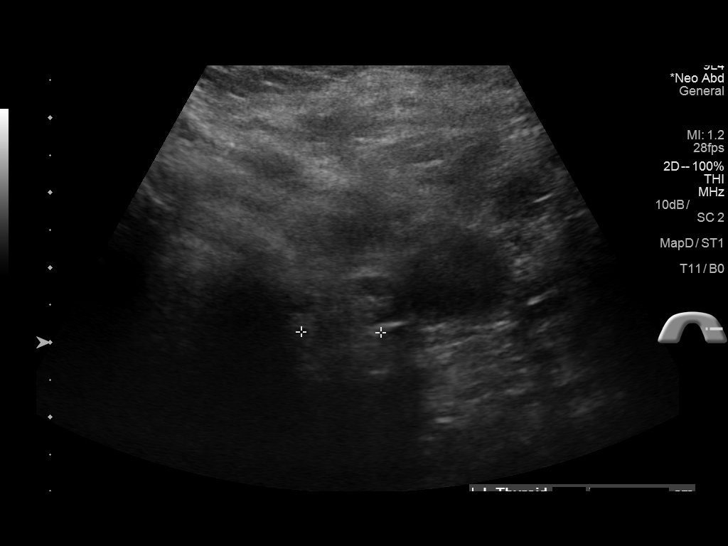
[im 36/40]
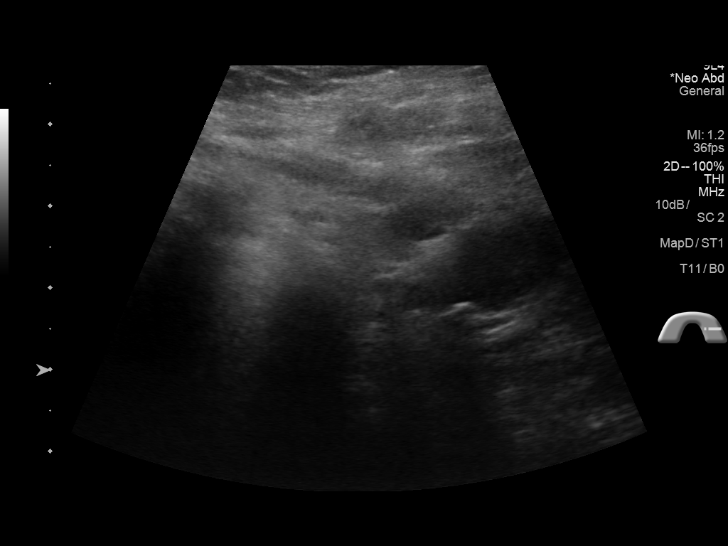
[im 40/40]
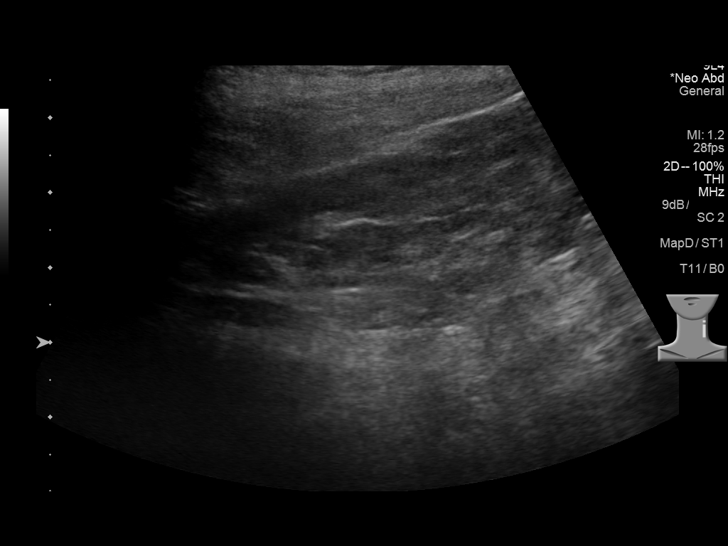

[13 of 25 positions shown; findings below may reference images not displayed]

FINDINGS: Limited evaluation due to body habitus and inpatient setting.

Right thyroid lobe

Measurements: 2.6 x 1.5 x 1.4 cm.  No nodules visualized.

Left thyroid lobe

Measurements: 2.2 x 1.1 x 1.1 cm.  No nodules visualized.

Isthmus

Thickness: 4 mm.  No nodules visualized.

Lymphadenopathy

2.3 x 1.6 x 1.3 cm right paratracheal lesion (image 37) appears
separate/distinct from the right thyroid gland, favoring a lymph
node, although this is poorly visualized on this inpatient
ultrasound.
IMPRESSION: 2.3 x 1.6 x 1.3 cm right paratracheal lesion appears
separate/distinct from the right thyroid gland, favoring a lymph
node, although poorly visualized on inpatient ultrasound.

In the setting of persistent nodular opacities in the posterior left
upper lobe and right perihilar region on multiple prior CTs, this
appearance is worrisome for nodal metastasis secondary to primary
bronchogenic neoplasm.

Consider bronchoscopy, percutaneous sampling, or outpatient PET-CT
for further evaluation.

## 2017-09-21 IMAGING — RF DG SWALLOWING FUNCTION - NRPT [facility]
13 of 19 series · 13 of 24 positions shown · non-contrast
Comparison: none

[Series 1: run · 1 of 164 frames shown (1 of 13)]
[frame 25/164]
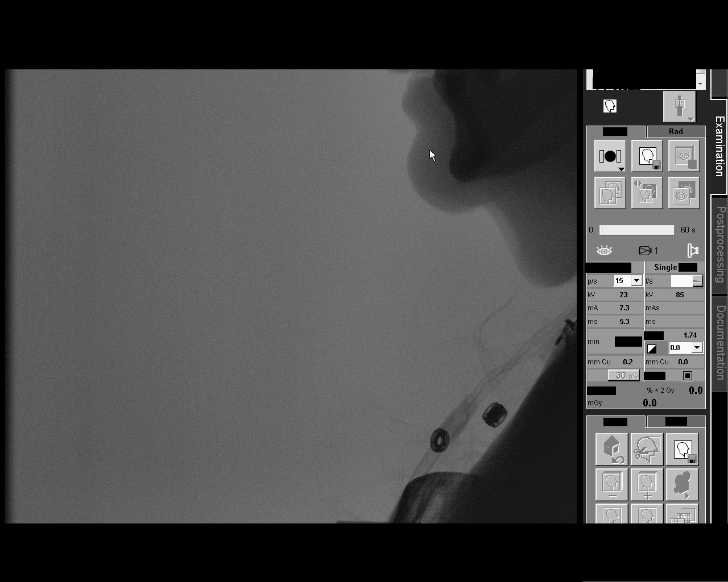

[Series 2: run · 1 of 18 frames shown (2 of 13)]
[frame 16/18]
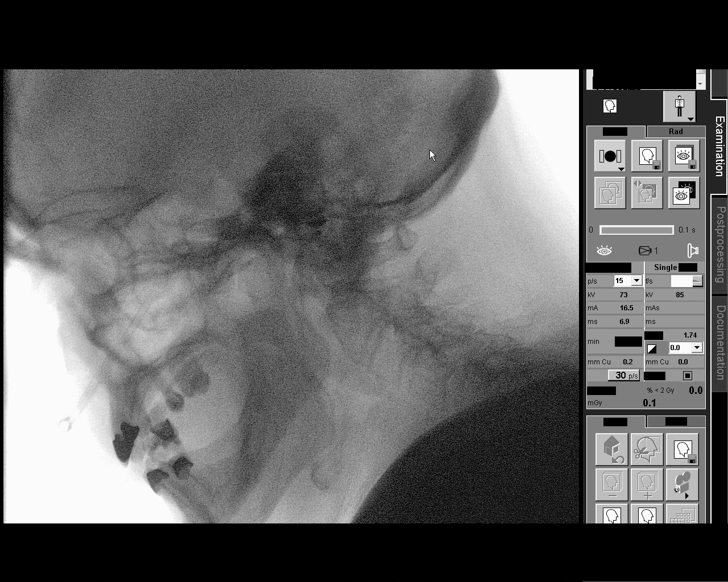

[Series 4: run · 1 of 209 frames shown (3 of 13)]
[frame 105/209]
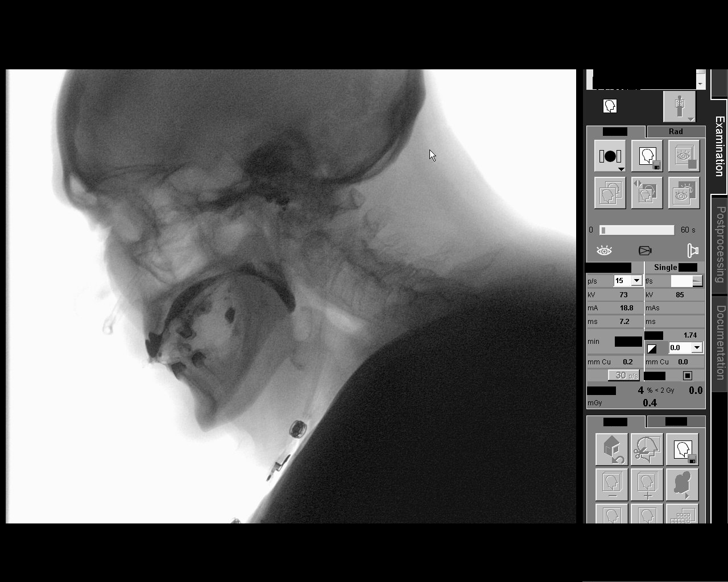

[Series 6: run · 1 of 31 frames shown (4 of 13)]
[frame 5/31]
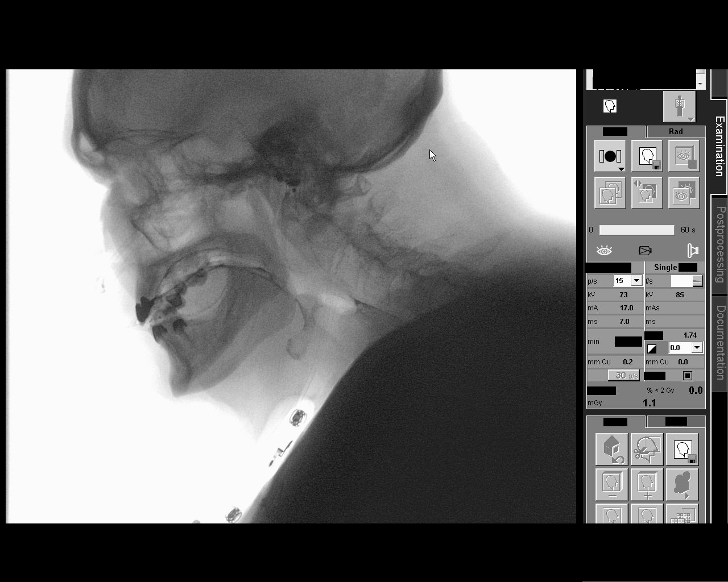

[Series 7: run · 1 of 37 frames shown (5 of 13)]
[frame 32/37]
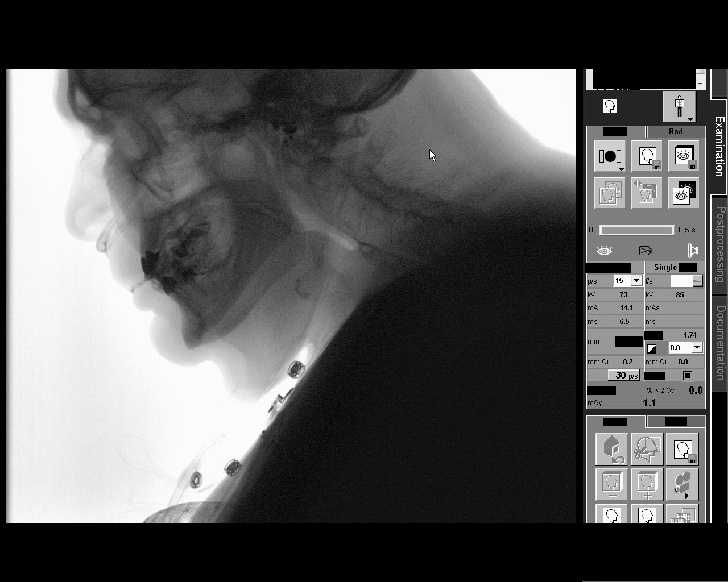

[Series 9: run · 1 of 374 frames shown (6 of 13)]
[frame 188/374]
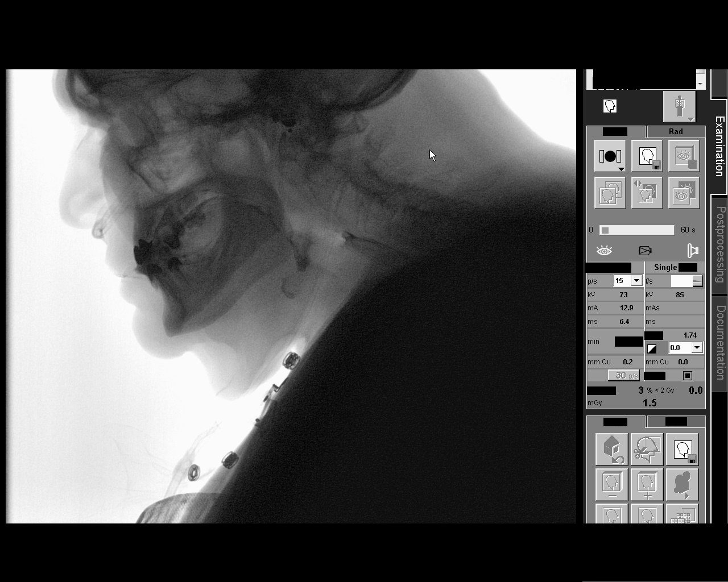

[Series 10: run · 1 of 351 frames shown (7 of 13)]
[frame 299/351]
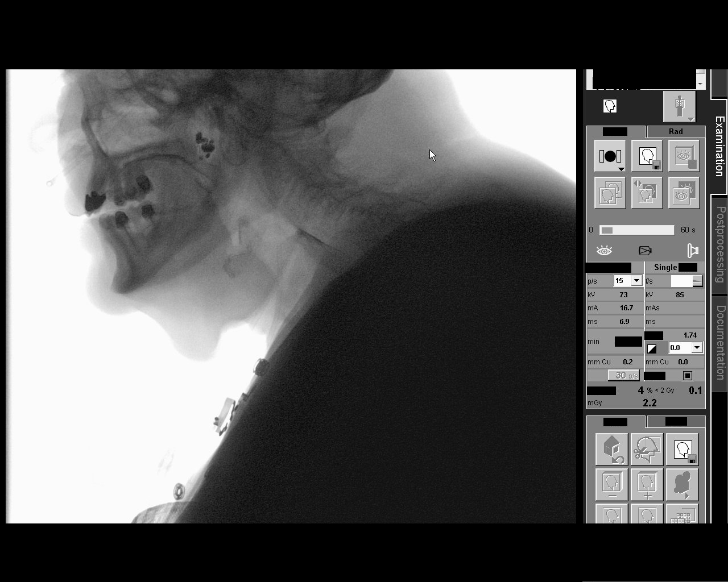

[Series 11: run · 1 of 136 frames shown (8 of 13)]
[frame 69/136]
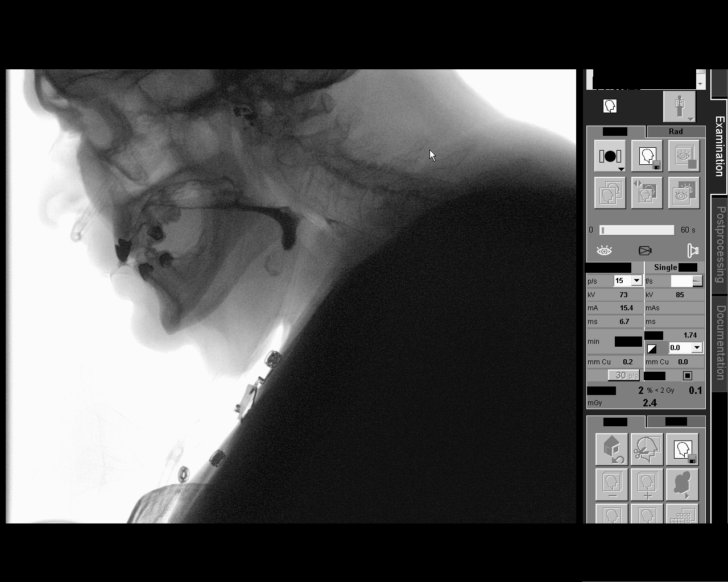

[Series 13: run · 1 of 471 frames shown (9 of 13)]
[frame 107/471]
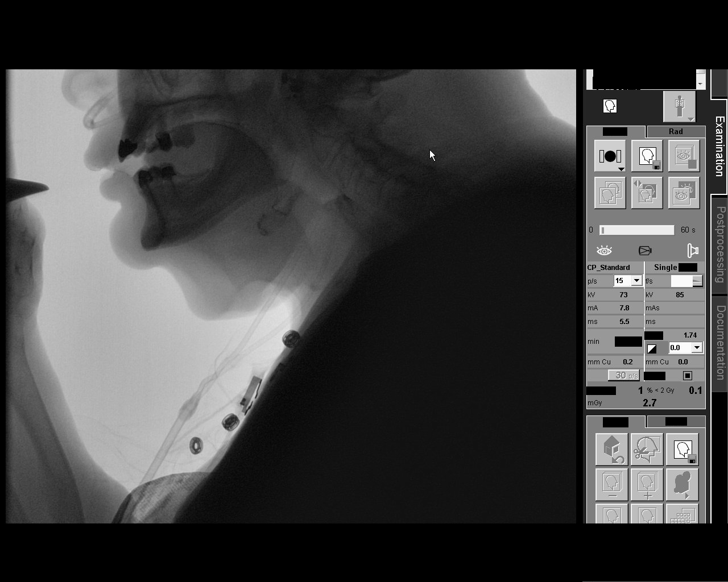

[Series 14: run · 1 of 64 frames shown (10 of 13)]
[frame 55/64]
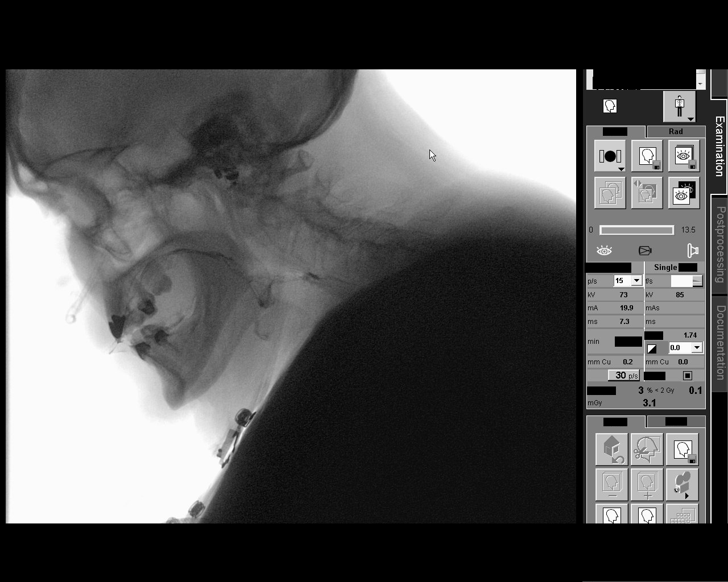

[Series 16: run · 1 of 271 frames shown (11 of 13)]
[frame 129/271]
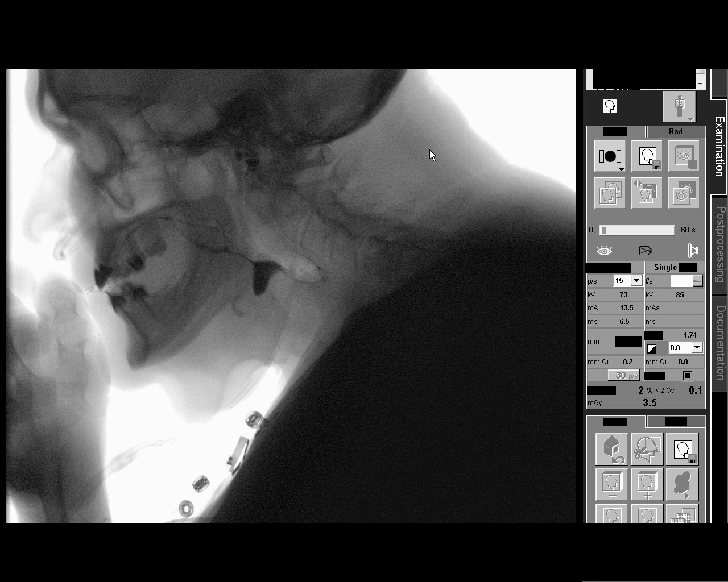

[Series 18: run · 1 of 692 frames shown (12 of 13)]
[frame 104/692]
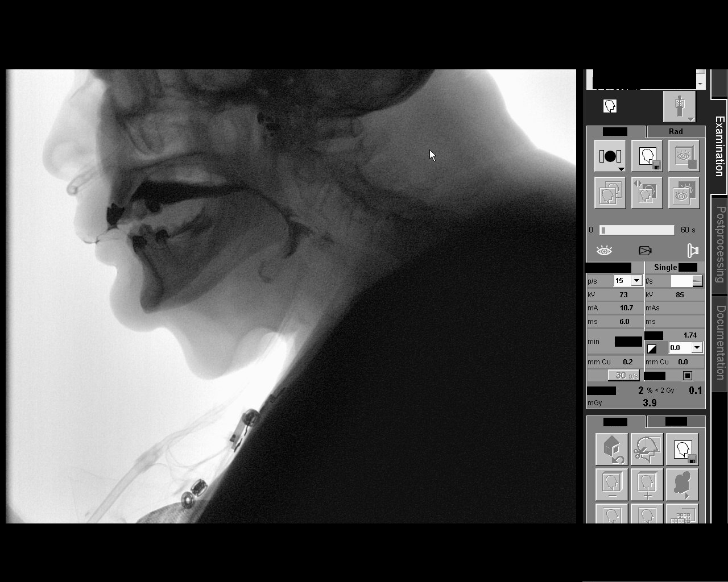

[Series 19: run · 1 of 647 frames shown (13 of 13)]
[frame 550/647]
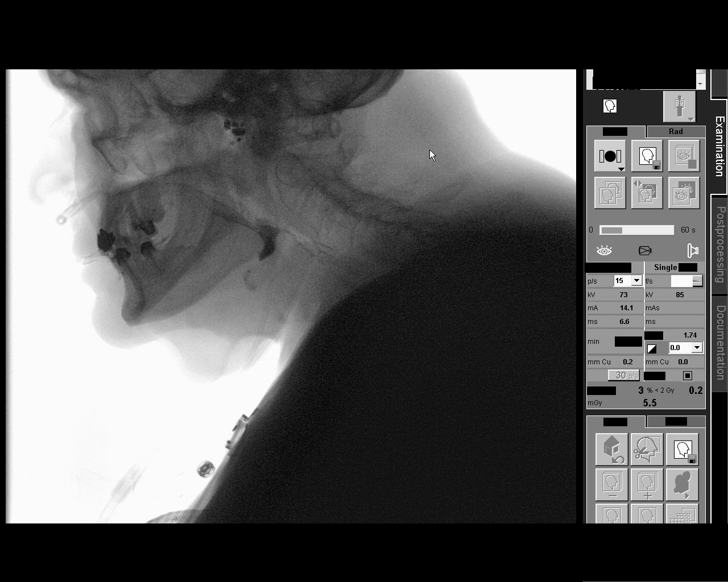

[13 of 24 positions shown; findings below may reference images not displayed]

FLUOROSCOPY FOR SWALLOWING FUNCTION STUDY:
Fluoroscopy was provided for swallowing function study, which was administered by a speech pathologist.  Final results and recommendations from this study are contained within the speech pathology report.
